# Patient Record
Sex: Male | Born: 1990 | Race: White | Hispanic: No | Marital: Single | Smoking: Never smoker
Health system: Southern US, Community
[De-identification: ages and names within clinical notes are randomized; demographics above are authoritative.]

## PROBLEM LIST (undated history)

## (undated) DIAGNOSIS — F840 Autistic disorder: Secondary | ICD-10-CM

---
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
## 2015-01-21 DIAGNOSIS — G8929 Other chronic pain: Secondary | ICD-10-CM | POA: Insufficient documentation

## 2015-01-21 DIAGNOSIS — F840 Autistic disorder: Secondary | ICD-10-CM | POA: Insufficient documentation

## 2015-01-21 DIAGNOSIS — R51 Headache: Secondary | ICD-10-CM

## 2015-01-21 DIAGNOSIS — G43909 Migraine, unspecified, not intractable, without status migrainosus: Secondary | ICD-10-CM | POA: Insufficient documentation

## 2015-05-06 ENCOUNTER — Emergency Department
Admission: EM | Admit: 2015-05-06 | Discharge: 2015-05-07 | Disposition: A | Discharge status: Home / Self Care | Payer: BLUE CROSS/BLUE SHIELD | Attending: Emergency Medicine | Admitting: Emergency Medicine

## 2015-05-06 ENCOUNTER — Encounter: Payer: Self-pay | Admitting: Urgent Care

## 2015-05-06 DIAGNOSIS — Z23 Encounter for immunization: Secondary | ICD-10-CM | POA: Insufficient documentation

## 2015-05-06 DIAGNOSIS — Y9289 Other specified places as the place of occurrence of the external cause: Secondary | ICD-10-CM | POA: Insufficient documentation

## 2015-05-06 DIAGNOSIS — S61011A Laceration without foreign body of right thumb without damage to nail, initial encounter: Secondary | ICD-10-CM

## 2015-05-06 DIAGNOSIS — W260XXA Contact with knife, initial encounter: Secondary | ICD-10-CM | POA: Insufficient documentation

## 2015-05-06 DIAGNOSIS — Y9389 Activity, other specified: Secondary | ICD-10-CM | POA: Diagnosis not present

## 2015-05-06 DIAGNOSIS — Y998 Other external cause status: Secondary | ICD-10-CM | POA: Insufficient documentation

## 2015-05-06 HISTORY — DX: Autistic disorder: F84.0

## 2015-05-06 MED ORDER — LIDOCAINE-EPINEPHRINE-TETRACAINE (LET) SOLUTION
NASAL | Status: AC
  Administered 2015-05-07: 01:00:00
  Filled 2015-05-06: qty 3

## 2015-05-06 MED ORDER — TETANUS-DIPHTH-ACELL PERTUSSIS 5-2.5-18.5 LF-MCG/0.5 IM SUSP
0.5000 mL | Freq: Once | INTRAMUSCULAR | Status: AC
  Administered 2015-05-07: 0.5 mL via INTRAMUSCULAR
  Filled 2015-05-06: qty 0.5

## 2015-05-06 NOTE — ED Provider Notes (Signed)
Medical screening examination/treatment/procedure(s) were conducted as a shared visit with non-physician practitioner(s) and myself.  I personally evaluated the patient during the encounter.  Patient has a small laceration to the right hand without neurovascular deficit. Simple repair performed by physician assistant.  Mark CreamerMark Quale, MD 05/06/15 [phone]

## 2015-05-06 NOTE — ED Provider Notes (Signed)
CSN: [id number]     Arrival date & time 05/06/15  2310 History   First MD Initiated Contact with Patient 05/06/15 2346     Chief Complaint  Patient presents with  . Laceration     (Consider location/radiation/quality/duration/timing/severity/associated sxs/prior Treatment) HPI  24 year old Webb presents to emergency department for evaluation of right thumb laceration. Patient was sharpening a knife just prior to arrival when he cut his right thumb on the volar aspect of the interphalangeal joint. He has no limited range of motion. Bleeding is controlled. No loss of sensation. Tetanus status is unknown. Pain is moderate.  Past Medical History  Diagnosis Date  . Autism    History reviewed. No pertinent past surgical history. Family History  Problem Relation Age of Onset  . Healthy Mother   . Healthy Father   . Healthy Sister   . Healthy Brother    Social History  Substance Use Topics  . Smoking status: Never Smoker   . Smokeless tobacco: Never Used  . Alcohol Use: 0.0 oz/week    0 Standard drinks or equivalent per week    Review of Systems  Constitutional: Negative.  Negative for fever, chills, activity change and appetite change.  HENT: Negative for congestion, ear pain, mouth sores, rhinorrhea, sinus pressure, sore throat and trouble swallowing.   Eyes: Negative for photophobia, pain and discharge.  Respiratory: Negative for cough, chest tightness and shortness of breath.   Cardiovascular: Negative for chest pain and leg swelling.  Gastrointestinal: Negative for nausea, vomiting, abdominal pain, diarrhea and abdominal distention.  Genitourinary: Negative for dysuria and difficulty urinating.  Musculoskeletal: Negative for back pain, arthralgias and gait problem.  Skin: Positive for wound. Negative for color change and rash.  Neurological: Negative for dizziness and headaches.  Hematological: Negative for adenopathy.  Psychiatric/Behavioral: Negative for behavioral  problems and agitation.      Allergies  Review of patient's allergies indicates no known allergies.  Home Medications   Prior to Admission medications   Not on File   BP 133/65 mmHg  Pulse 102  Temp(Src) 98.8 F (37.1 C) (Oral)  Resp 18  Ht  (1.702 m)  Wt 77.111 kg  BMI 26.62 kg/m2  SpO2 98% Physical Exam  Constitutional: He is oriented to person, place, and time. He appears well-developed and well-nourished.  HENT:  Head: Normocephalic and atraumatic.  Eyes: Conjunctivae and EOM are normal. Pupils are equal, round, and reactive to light.  Neck: Normal range of motion. Neck supple.  Cardiovascular: Normal rate, regular rhythm and intact distal pulses.   Pulmonary/Chest: Effort normal. No respiratory distress. He has no rales.  Musculoskeletal:  Examination of the right hand shows patient has full composite fist. Grip strength 5 out of 5. Right thumb shows a volar to similar laceration that is linear along the interphalangeal joint. No tendon deficit is noted. He has full active and passive extension and flexion of the interphalangeal joint. No warmth erythema or drainage.  Neurological: He is alert and oriented to person, place, and time.  Skin: Skin is warm and dry.  Psychiatric: He has a normal mood and affect. His behavior is normal. Judgment and thought content normal.    ED Course  Procedures (including critical care time) LACERATION REPAIR Performed by: Patience Musca Authorized by: Patience Musca Consent: Verbal consent obtained. Risks and benefits: risks, benefits and alternatives were discussed Consent given by: patient Patient identity confirmed: provided demographic data Prepped and Draped in normal sterile fashion Wound explored  Laceration Location: Right thumb  Laceration Length: 2 cm  No Foreign Bodies seen or palpated  Anesthesia: local infiltration  Local anesthetic: Topical LET  Anesthetic total: 1.5  ml  Irrigation method: syringe Amount of cleaning: standard  Skin closure: 5-0 nylon    Number of sutures:3  Technique: Simple interrupted   Patient tolerance: Patient tolerated the procedure well with no immediate complications. Labs Review Labs Reviewed - No data to display  Imaging Review No results found. I have personally reviewed and evaluated these images and lab results as part of my medical decision-making.   EKG Interpretation None      MDM   Final diagnoses:  Thumb laceration, right, initial encounter    24 year old Webb with right thumb laceration. No tendon deficits noted. No signs of foreign body. Tetanus was updated in the emergency department. Wound was thoroughly irrigated and 3 nylon sutures were applied. He'll follow-up in 7-8 days for suture removal. He is educated on signs and symptoms return to the ER for. He will keep wound clean and dry.  Evon Slackhomas C Gaines, PA-C 05/07/15 0005  Arnaldo NatalPaul F Malinda, MD 05/07/15 [phone]

## 2015-05-06 NOTE — ED Notes (Signed)
Patient presents with c/o laceration to 1st digit of his RIGHT hand. Patient reports that he cut it with a knife. Bleeding controlled.

## 2015-05-06 NOTE — Discharge Instructions (Signed)
Laceration Care, Adult  A laceration is a cut that goes through all layers of the skin. The cut also goes into the tissue that is right under the skin. Some cuts heal on their own. Others need to be closed with stitches (sutures), staples, skin adhesive strips, or wound glue. Taking care of your cut lowers your risk of infection and helps your cut to heal better.  HOW TO TAKE CARE OF YOUR CUT  For stitches or staples:  · Keep the wound clean and dry.  · If you were given a bandage (dressing), you should change it at least one time per day or as told by your doctor. You should also change it if it gets wet or dirty.  · Keep the wound completely dry for the first 24 hours or as told by your doctor. After that time, you may take a shower or a bath. However, make sure that the wound is not soaked in water until after the stitches or staples have been removed.  · Clean the wound one time each day or as told by your doctor:    Wash the wound with soap and water.    Rinse the wound with water until all of the soap comes off.    Pat the wound dry with a clean towel. Do not rub the wound.  · After you clean the wound, put a thin layer of antibiotic ointment on it as told by your doctor. This ointment:    Helps to prevent infection.    Keeps the bandage from sticking to the wound.  · Have your stitches or staples removed as told by your doctor.  If your doctor used skin adhesive strips:   · Keep the wound clean and dry.  · If you were given a bandage, you should change it at least one time per day or as told by your doctor. You should also change it if it gets dirty or wet.  · Do not get the skin adhesive strips wet. You can take a shower or a bath, but be careful to keep the wound dry.  · If the wound gets wet, pat it dry with a clean towel. Do not rub the wound.  · Skin adhesive strips fall off on their own. You can trim the strips as the wound heals. Do not remove any strips that are still stuck to the wound. They will  fall off after a while.  If your doctor used wound glue:  · Try to keep your wound dry, but you may briefly wet it in the shower or bath. Do not soak the wound in water, such as by swimming.  · After you take a shower or a bath, gently pat the wound dry with a clean towel. Do not rub the wound.  · Do not do any activities that will make you really sweaty until the skin glue has fallen off on its own.  · Do not apply liquid, cream, or ointment medicine to your wound while the skin glue is still on.  · If you were given a bandage, you should change it at least one time per day or as told by your doctor. You should also change it if it gets dirty or wet.  · If a bandage is placed over the wound, do not let the tape for the bandage touch the skin glue.  · Do not pick at the glue. The skin glue usually stays on for 5-10 days. Then, it   falls off of the skin.  General Instructions   · To help prevent scarring, make sure to cover your wound with sunscreen whenever you are outside after stitches are removed, after adhesive strips are removed, or when wound glue stays in place and the wound is healed. Make sure to wear a sunscreen of at least 30 SPF.  · Take over-the-counter and prescription medicines only as told by your doctor.  · If you were given antibiotic medicine or ointment, take or apply it as told by your doctor. Do not stop using the antibiotic even if your wound is getting better.  · Do not scratch or pick at the wound.  · Keep all follow-up visits as told by your doctor. This is important.  · Check your wound every day for signs of infection. Watch for:    Redness, swelling, or pain.    Fluid, blood, or pus.  · Raise (elevate) the injured area above the level of your heart while you are sitting or lying down, if possible.  GET HELP IF:  · You got a tetanus shot and you have any of these problems at the injection site:    Swelling.    Very bad pain.    Redness.    Bleeding.  · You have a fever.  · A wound that was  closed breaks open.  · You notice a bad smell coming from your wound or your bandage.  · You notice something coming out of the wound, such as wood or glass.  · Medicine does not help your pain.  · You have more redness, swelling, or pain at the site of your wound.  · You have fluid, blood, or pus coming from your wound.  · You notice a change in the color of your skin near your wound.  · You need to change the bandage often because fluid, blood, or pus is coming from the wound.  · You start to have a new rash.  · You start to have numbness around the wound.  GET HELP RIGHT AWAY IF:  · You have very bad swelling around the wound.  · Your pain suddenly gets worse and is very bad.  · You notice painful lumps near the wound or on skin that is anywhere on your body.  · You have a red streak going away from your wound.  · The wound is on your hand or foot and you cannot move a finger or toe like you usually can.  · The wound is on your hand or foot and you notice that your fingers or toes look pale or bluish.     This information is not intended to replace advice given to you by your health care provider. Make sure you discuss any questions you have with your health care provider.     Document Released: 11/17/2007 Document Revised: 10/15/2014 Document Reviewed: 05/27/2014  Elsevier Interactive Patient Education ©2016 Elsevier Inc.

## 2015-05-07 DIAGNOSIS — S61011A Laceration without foreign body of right thumb without damage to nail, initial encounter: Secondary | ICD-10-CM | POA: Diagnosis not present

## 2015-05-16 ENCOUNTER — Ambulatory Visit: Payer: Self-pay | Admitting: Family Medicine

## 2015-12-08 ENCOUNTER — Ambulatory Visit (INDEPENDENT_AMBULATORY_CARE_PROVIDER_SITE_OTHER): Payer: BLUE CROSS/BLUE SHIELD | Admitting: Family Medicine

## 2015-12-08 ENCOUNTER — Encounter: Payer: Self-pay | Admitting: Family Medicine

## 2015-12-08 VITALS — BP 122/70 | HR 88 | Temp 98.7°F | Resp 16 | Wt 174.0 lb

## 2015-12-08 DIAGNOSIS — R51 Headache: Secondary | ICD-10-CM

## 2015-12-08 DIAGNOSIS — R519 Headache, unspecified: Secondary | ICD-10-CM

## 2015-12-08 DIAGNOSIS — F840 Autistic disorder: Secondary | ICD-10-CM | POA: Diagnosis not present

## 2015-12-08 DIAGNOSIS — R7989 Other specified abnormal findings of blood chemistry: Secondary | ICD-10-CM | POA: Diagnosis not present

## 2015-12-08 DIAGNOSIS — Z0000 Encounter for general adult medical examination without abnormal findings: Secondary | ICD-10-CM | POA: Diagnosis not present

## 2015-12-08 NOTE — Progress Notes (Signed)
Patient: Mark Webb, Male    DOB: 09/15/1990, 25 y.o.   MRN: [id number] Visit Date: 12/08/2015  Today's Provider: Dortha Kernennis Chrismon, PA   No chief complaint on file.  Subjective:    Annual physical exam Mark Webb is a 25 y.o. male who presents today for health maintenance and complete physical. He feels well. He reports exercising . He reports he is sleeping fairly well.   Headache  Patient also reports that he has had a headache for several years that is becoming more frequent. Usually has an average of 2 headaches a week that last around one hour each. Patient reports that he has decreased caffeine beverages and occasionally takes Advil to help with symptoms.    Review of Systems  Constitutional: Negative.   Eyes: Negative.   Respiratory: Negative.   Cardiovascular: Negative.   Gastrointestinal: Negative.   Endocrine: Negative.   Genitourinary: Negative.   Musculoskeletal: Negative.   Skin: Negative.   Allergic/Immunologic: Negative.   Neurological: Positive for headaches.  Hematological: Negative.     Social History      He  reports that he has never smoked. He has never used smokeless tobacco. He reports that he drinks alcohol. He reports that he does not use illicit drugs.       Social History   Social History  . Marital Status: Single    Spouse Name: N/A  . Number of Children: N/A  . Years of Education: N/A   Social History Main Topics  . Smoking status: Never Smoker   . Smokeless tobacco: Never Used  . Alcohol Use: 0.0 oz/week    0 Standard drinks or equivalent per week  . Drug Use: No  . Sexual Activity: Not on file   Other Topics Concern  . Not on file   Social History Narrative   Past Medical History  Diagnosis Date  . Autism    Patient Active Problem List   Diagnosis Date Noted  . Active autistic disorder 01/21/2015  . Generalized headache 01/21/2015  . Migraine 01/21/2015    No past surgical history on file.  Family  History        Family Status  Relation Status Death Age  . Mother Alive   . Father Alive   . Sister Alive   . Brother Alive         His family history includes Healthy in his brother, father, mother, and sister.    No Known Allergies  No outpatient prescriptions have been marked as taking for the 12/08/15 encounter (Appointment) with Tamsen Roersennis E Chrismon, PA.    Patient Care Team: Tamsen Roersennis E Chrismon, PA as PCP - General (Physician Assistant)     Objective:   Vitals: BP 122/70 mmHg  Pulse 88  Temp(Src) 98.7 F (37.1 C)  Resp 16  Wt 174 lb (78.926 kg) Body mass index is 27.25 kg/(m^2). Wt Readings from Last 3 Encounters:  12/08/15 174 lb (78.926 kg)  05/06/15 170 lb (77.111 kg)  07/12/14 141 lb (63.957 kg)   Physical Exam  Constitutional: He appears well-developed and well-nourished.  HENT:  Head: Normocephalic.  Right Ear: External ear normal.  Left Ear: External ear normal.  Nose: Nose normal.  Mouth/Throat: Oropharynx is clear and moist.  Eyes: Conjunctivae and EOM are normal.  Neck: Neck supple. No thyromegaly present.  Cardiovascular: Normal rate and regular rhythm.   Pulmonary/Chest: Effort normal and breath sounds normal.  Abdominal: Bowel sounds are normal.  Genitourinary:  Anxious and uncooperative for exam.  Musculoskeletal: Normal range of motion.  Lymphadenopathy:    He has no cervical adenopathy.  Neurological: Coordination normal.  Uncooperative for assessment of cranial nerves. No muscle weakness.  Skin:  Mild itchy rash on lower legs/ankles.  Psychiatric:  Very quiet and difficult to assess due to autism.   Assessment & Plan:     Routine Health Maintenance and Physical Exam  Exercise Activities and Dietary recommendations Goals    Very active outdoors and helping grandparents. No formal exercise program.      Immunization History  Administered Date(s) Administered  . Tdap 05/07/2015    There are no preventive care reminders to display  for this patient.    Discussed health benefits of physical activity, and encouraged him to engage in regular exercise appropriate for his age and condition.    -------------------------------------------------------------------- 1. Annual physical exam General health stable. Eats 2 meals a day. Has not lost weight. Helps grandparents with their garden daily and enjoys outdoor activities. Last Tdap was 05-07-15 because he cut his left thumb and needed stitches. Given anticipatory guidance with his history of autism. Recheck routine labs. - CBC with Differential/Platelet - Comprehensive metabolic panel - TSH - Lipid panel  2. Generalized headache Has 1-2 headaches a week and takes Advil or Tylenol prn with good relief in an hour. No nausea, vision disturbance, photosensitivity or URI symptoms. Headache is usually in the back of his neck or occiput. Not unilateral. May use OTC meds prn and recheck labs. If no imbalances, may need referral to neurologist. - CBC with Differential/Platelet - Comprehensive metabolic panel - TSH  3. Active autistic disorder Anxious with introversion mentally. Difficult to cooperate with exams. Stable and not on any medications. Accompanied by mother.  4. Low serum vitamin D History of Vitamin D 18.7 one year ago. Has not continued supplementation. May be part of headaches. Will recheck blood levels. - VITAMIN D 25 Hydroxy (Vit-D Deficiency, Fractures)    Dortha Kernennis Chrismon, PA  [facility]

## 2015-12-09 ENCOUNTER — Other Ambulatory Visit: Payer: Self-pay | Admitting: Family Medicine

## 2015-12-10 LAB — COMPREHENSIVE METABOLIC PANEL
ALBUMIN: 4.5 g/dL (ref 3.5–5.5)
ALK PHOS: 71 IU/L (ref 39–117)
ALT: 16 IU/L (ref 0–44)
AST: 19 IU/L (ref 0–40)
Albumin/Globulin Ratio: 1.4 (ref 1.2–2.2)
BUN/Creatinine Ratio: 10 (ref 9–20)
BUN: 10 mg/dL (ref 6–20)
Bilirubin Total: <0.2 mg/dL (ref 0.0–1.2)
CO2: 24 mmol/L (ref 18–29)
CREATININE: 0.97 mg/dL (ref 0.76–1.27)
Calcium: 9.4 mg/dL (ref 8.7–10.2)
Chloride: 101 mmol/L (ref 96–106)
GFR calc Af Amer: 126 mL/min/{1.73_m2} (ref >59)
GFR, EST NON AFRICAN AMERICAN: 109 mL/min/{1.73_m2} (ref >59)
GLOBULIN, TOTAL: 3.2 g/dL (ref 1.5–4.5)
Glucose: 91 mg/dL (ref 65–99)
Potassium: 3.9 mmol/L (ref 3.5–5.2)
SODIUM: 139 mmol/L (ref 134–144)
Total Protein: 7.7 g/dL (ref 6.0–8.5)

## 2015-12-10 LAB — LIPID PANEL
CHOLESTEROL TOTAL: 272 mg/dL — AB (ref 100–199)
Chol/HDL Ratio: 7.6 ratio units — ABNORMAL HIGH (ref 0.0–5.0)
HDL: 36 mg/dL — AB (ref >39)
LDL CALC: 178 mg/dL — AB (ref 0–99)
Triglycerides: 288 mg/dL — ABNORMAL HIGH (ref 0–149)
VLDL CHOLESTEROL CAL: 58 mg/dL — AB (ref 5–40)

## 2015-12-10 LAB — CBC WITH DIFFERENTIAL/PLATELET
BASOS ABS: 0 10*3/uL (ref 0.0–0.2)
Basos: 1 %
EOS (ABSOLUTE): 0.1 10*3/uL (ref 0.0–0.4)
EOS: 1 %
HEMATOCRIT: 42.1 % (ref 37.5–51.0)
HEMOGLOBIN: 14.5 g/dL (ref 12.6–17.7)
IMMATURE GRANS (ABS): 0 10*3/uL (ref 0.0–0.1)
Immature Granulocytes: 0 %
LYMPHS ABS: 2.1 10*3/uL (ref 0.7–3.1)
LYMPHS: 36 %
MCH: 29.6 pg (ref 26.6–33.0)
MCHC: 34.4 g/dL (ref 31.5–35.7)
MCV: 86 fL (ref 79–97)
MONOCYTES: 11 %
Monocytes Absolute: 0.6 10*3/uL (ref 0.1–0.9)
NEUTROS ABS: 2.9 10*3/uL (ref 1.4–7.0)
Neutrophils: 51 %
Platelets: 313 10*3/uL (ref 150–379)
RBC: 4.9 x10E6/uL (ref 4.14–5.80)
RDW: 13.4 % (ref 12.3–15.4)
WBC: 5.8 10*3/uL (ref 3.4–10.8)

## 2015-12-10 LAB — TSH: TSH: 1.94 u[IU]/mL (ref 0.450–4.500)

## 2015-12-10 LAB — VITAMIN D 25 HYDROXY (VIT D DEFICIENCY, FRACTURES): VIT D 25 HYDROXY: 32.5 ng/mL (ref 30.0–100.0)

## 2016-06-23 ENCOUNTER — Ambulatory Visit (INDEPENDENT_AMBULATORY_CARE_PROVIDER_SITE_OTHER): Payer: BLUE CROSS/BLUE SHIELD | Admitting: Physician Assistant

## 2016-06-23 ENCOUNTER — Ambulatory Visit
Admission: RE | Admit: 2016-06-23 | Discharge: 2016-06-23 | Disposition: A | Discharge status: Home / Self Care | Payer: BLUE CROSS/BLUE SHIELD | Source: Ambulatory Visit | Attending: Physician Assistant | Admitting: Physician Assistant

## 2016-06-23 ENCOUNTER — Encounter: Payer: Self-pay | Admitting: Physician Assistant

## 2016-06-23 VITALS — BP 120/60 | HR 70 | Temp 98.2°F | Resp 16 | Wt 175.2 lb

## 2016-06-23 DIAGNOSIS — R195 Other fecal abnormalities: Secondary | ICD-10-CM | POA: Diagnosis not present

## 2016-06-23 DIAGNOSIS — R1032 Left lower quadrant pain: Secondary | ICD-10-CM | POA: Insufficient documentation

## 2016-06-23 NOTE — Progress Notes (Signed)
Patient's mother Doran Heaterina Nuss (on consent) advised.

## 2016-06-23 NOTE — Progress Notes (Signed)
Name: Mark Webb   MRN: [id number]    DOB: 05/25/1991   Date:06/23/2016       Progress Note  Subjective  Chief Complaint  Chief Complaint  Patient presents with  . Abdominal Pain    HPI Patient is a 26 y/o male with autism who comes in office today accompanied by his mother with concerns of LLQ pain for the past three days. It is a bit difficult to obtain history, but the patient and mother both answer questions. Patient describes pain as stabbing in his LLQ upon standing. Patient did report G.I upset with constipation. Patient has had no changes in his daily activities or eating habits. Patient reports he had a bowel movement last night that was small and that he had to strain for. No blood in stool. No fever, chills, nausea, vomiting. He reports he has had to pee more but there has been no burning. He doesn't have any back or flank pain. No history of diverticulitis or kidney stone. The pain does not seem to radiate. Patient describes his pain as "not very painful." Tried azo tabs yesterday without much relief. Mother is worried about appendicitis.   Past Medical History:  Diagnosis Date  . Autism     Social History  Substance Use Topics  . Smoking status: Never Smoker  . Smokeless tobacco: Never Used  . Alcohol use 0.0 oz/week    No current outpatient prescriptions on file.  No Known Allergies  Review of Systems  Constitutional: Negative for chills, diaphoresis, fever, malaise/fatigue and weight loss.  HENT: Negative.   Eyes: Negative.   Respiratory: Negative.   Cardiovascular: Negative.   Gastrointestinal: Positive for abdominal pain and constipation. Negative for blood in stool, diarrhea, heartburn, melena, nausea and vomiting.  Genitourinary: Positive for frequency. Negative for dysuria, flank pain, hematuria and urgency.  Musculoskeletal: Positive for back pain. Negative for falls, joint pain, myalgias and neck pain.  Skin: Negative.   Neurological: Negative.   Negative for weakness.  Endo/Heme/Allergies: Negative.   Psychiatric/Behavioral: Negative.       Objective  Vitals:   06/23/16 1339  BP: 120/60  Pulse: 70  Resp: 16  Temp: 98.2 F (36.8 C)  TempSrc: Oral  Weight: 175 lb 3.2 oz (79.5 kg)     Physical Exam  Constitutional: He is well-developed, well-nourished, and in no distress. No distress.  Cardiovascular: Normal rate and regular rhythm.   Pulmonary/Chest: Effort normal and breath sounds normal.  Abdominal: Soft. Bowel sounds are normal. He exhibits no distension and no mass. There is tenderness in the left lower quadrant. There is no rigidity, no rebound, no guarding, no CVA tenderness, no tenderness at McBurney's point and negative Murphy's sign. No hernia.  Some mild tenderness in LLQ on deep palpation.  Neurological: He is alert.  Skin: He is not diaphoretic.    No results found for this or any previous visit (from the past 2160 hour(s)).   Assessment & Plan  1. LLQ pain  Patient afebrile, nontoxic and is presenting with LLQ pain concerning for constipation, possible kidney stone, diverticulitis, UTI. Low suspicion for appendicitis. No peritoneal signs. Patient did give urine sample today in office but had azo tabs yesterday so will send off for UA and culture. Patient is bit of an unclear historian and so will evaluate with labs and imaging as below to assess for white count, kidney/liver function, and abdominal pathology.  - Comprehensive metabolic panel - CBC with Differential/Platelet - DG Abd 1 View; Future -  Urinalysis - CULTURE, URINE COMPREHENSIVE  The entirety of the information documented in the History of Present Illness, Review of Systems and Physical Exam were personally obtained by me. Portions of this information were initially documented by Natalia LeatherwoodKatherine and reviewed by me for thoroughness and accuracy.   Return if symptoms worsen or fail to improve.      Patient Instructions  Abdominal Pain,  Adult Many things can cause belly (abdominal) pain. Most times, belly pain is not dangerous. Many cases of belly pain can be watched and treated at home. Sometimes belly pain is serious, though. Your doctor will try to find the cause of your belly pain. Follow these instructions at home:  Take over-the-counter and prescription medicines only as told by your doctor. Do not take medicines that help you poop (laxatives) unless told to by your doctor.  Drink enough fluid to keep your pee (urine) clear or pale yellow.  Watch your belly pain for any changes.  Keep all follow-up visits as told by your doctor. This is important. Contact a doctor if:  Your belly pain changes or gets worse.  You are not hungry, or you lose weight without trying.  You are having trouble pooping (constipated) or have watery poop (diarrhea) for more than 2-3 days.  You have pain when you pee or poop.  Your belly pain wakes you up at night.  Your pain gets worse with meals, after eating, or with certain foods.  You are throwing up and cannot keep anything down.  You have a fever. Get help right away if:  Your pain does not go away as soon as your doctor says it should.  You cannot stop throwing up.  Your pain is only in areas of your belly, such as the right side or the left lower part of the belly.  You have bloody or black poop, or poop that looks like tar.  You have very bad pain, cramping, or bloating in your belly.  You have signs of not having enough fluid or water in your body (dehydration), such as:  Dark pee, very little pee, or no pee.  Cracked lips.  Dry mouth.  Sunken eyes.  Sleepiness.  Weakness. This information is not intended to replace advice given to you by your health care provider. Make sure you discuss any questions you have with your health care provider. Document Released: 11/17/2007 Document Revised: 12/19/2015 Document Reviewed: 11/12/2015 Elsevier Interactive Patient  Education  2017 [employer]

## 2016-06-23 NOTE — Patient Instructions (Signed)

## 2016-06-24 ENCOUNTER — Other Ambulatory Visit: Payer: Self-pay | Admitting: Physician Assistant

## 2016-06-24 DIAGNOSIS — N390 Urinary tract infection, site not specified: Secondary | ICD-10-CM

## 2016-06-24 LAB — COMPREHENSIVE METABOLIC PANEL
ALT: 17 IU/L (ref 0–44)
AST: 18 IU/L (ref 0–40)
Albumin/Globulin Ratio: 1.7 (ref 1.2–2.2)
Albumin: 4.3 g/dL (ref 3.5–5.5)
Alkaline Phosphatase: 67 IU/L (ref 39–117)
BUN/Creatinine Ratio: 12 (ref 9–20)
BUN: 10 mg/dL (ref 6–20)
Bilirubin Total: 0.3 mg/dL (ref 0.0–1.2)
CO2: 25 mmol/L (ref 18–29)
Calcium: 9.6 mg/dL (ref 8.7–10.2)
Chloride: 100 mmol/L (ref 96–106)
Creatinine, Ser: 0.85 mg/dL (ref 0.76–1.27)
GFR calc Af Amer: 140 mL/min/{1.73_m2} (ref >59)
GFR calc non Af Amer: 121 mL/min/{1.73_m2} (ref >59)
Globulin, Total: 2.6 g/dL (ref 1.5–4.5)
Glucose: 87 mg/dL (ref 65–99)
Potassium: 3.8 mmol/L (ref 3.5–5.2)
Sodium: 140 mmol/L (ref 134–144)
Total Protein: 6.9 g/dL (ref 6.0–8.5)

## 2016-06-24 LAB — CBC WITH DIFFERENTIAL/PLATELET
Basophils Absolute: 0 10*3/uL (ref 0.0–0.2)
Basos: 1 %
EOS (ABSOLUTE): 0.1 10*3/uL (ref 0.0–0.4)
Eos: 1 %
Hematocrit: 40.5 % (ref 37.5–51.0)
Hemoglobin: 13.6 g/dL (ref 13.0–17.7)
Immature Grans (Abs): 0 10*3/uL (ref 0.0–0.1)
Immature Granulocytes: 0 %
Lymphocytes Absolute: 2.3 10*3/uL (ref 0.7–3.1)
Lymphs: 33 %
MCH: 28.9 pg (ref 26.6–33.0)
MCHC: 33.6 g/dL (ref 31.5–35.7)
MCV: 86 fL (ref 79–97)
Monocytes Absolute: 0.7 10*3/uL (ref 0.1–0.9)
Monocytes: 10 %
Neutrophils Absolute: 3.9 10*3/uL (ref 1.4–7.0)
Neutrophils: 55 %
Platelets: 277 10*3/uL (ref 150–379)
RBC: 4.7 x10E6/uL (ref 4.14–5.80)
RDW: 13.6 % (ref 12.3–15.4)
WBC: 6.9 10*3/uL (ref 3.4–10.8)

## 2016-06-24 LAB — URINALYSIS
Bilirubin, UA: NEGATIVE
Glucose, UA: NEGATIVE
Ketones, UA: NEGATIVE
Nitrite, UA: POSITIVE — AB
Protein, UA: NEGATIVE
RBC, UA: NEGATIVE
Specific Gravity, UA: 1.01 (ref 1.005–1.030)
Urobilinogen, Ur: 1 mg/dL (ref 0.2–1.0)
pH, UA: 5.5 (ref 5.0–7.5)

## 2016-06-24 MED ORDER — CIPROFLOXACIN HCL 500 MG PO TABS: 500.0000 mg | ORAL_TABLET | Freq: Two times a day (BID) | ORAL | 0 refills | Status: AC

## 2016-06-24 NOTE — Progress Notes (Signed)
Mrs. Freida Busmanllen advised as directed below.  She will get Luisa Hartatrick started on Cipro.  She states that he is not sexually active.        Abdominal Xray showed constipation. CBC and CMET were normal. UA showed signs of infection. Will send in ciprofloxacin 500 mg BID x 14 days. One question I didn't address in office was is there any sexual exposure or risk of STDs? We ask this question because urinary tract infections in men are generally rare. If not, please continue with ciprofloxacin for the prescribed course and we will await culture.

## 2016-06-26 LAB — CULTURE, URINE COMPREHENSIVE

## 2016-06-28 ENCOUNTER — Telehealth: Payer: Self-pay

## 2016-06-28 ENCOUNTER — Other Ambulatory Visit: Payer: Self-pay | Admitting: Physician Assistant

## 2016-06-28 DIAGNOSIS — K5900 Constipation, unspecified: Secondary | ICD-10-CM

## 2016-06-28 MED ORDER — DOCUSATE SODIUM 100 MG PO CAPS: 100.0000 mg | ORAL_CAPSULE | Freq: Two times a day (BID) | ORAL | 0 refills | Status: AC

## 2016-06-28 NOTE — Telephone Encounter (Signed)
Patient's mother called and states she has not heard results of patient's lab results. She is aware of urine and xray results. Please review-aa

## 2016-06-28 NOTE — Progress Notes (Signed)
Spoke with patient's mother in clinic today and patient is having a lot of constipation which is aggravating hemorrhoids. XRAY showed constipation. Will prescribe docusate sodium for one week as stool softener. May use OTC meds for presumed hemorrhoids and hopefully this will help with flare.

## 2016-06-28 NOTE — Telephone Encounter (Signed)
Mother advised-aa 

## 2016-06-28 NOTE — Telephone Encounter (Signed)
CBC and CMET were normal. Thank you.

## 2016-07-02 ENCOUNTER — Ambulatory Visit (INDEPENDENT_AMBULATORY_CARE_PROVIDER_SITE_OTHER): Payer: BLUE CROSS/BLUE SHIELD | Admitting: Physician Assistant

## 2016-07-02 ENCOUNTER — Encounter: Payer: Self-pay | Admitting: Physician Assistant

## 2016-07-02 VITALS — BP 124/82 | HR 80 | Temp 98.6°F | Resp 16 | Wt 167.0 lb

## 2016-07-02 DIAGNOSIS — K5900 Constipation, unspecified: Secondary | ICD-10-CM

## 2016-07-02 DIAGNOSIS — K6289 Other specified diseases of anus and rectum: Secondary | ICD-10-CM

## 2016-07-02 MED ORDER — FLEET ENEMA 7-19 GM/118ML RE ENEM: 1.0000 | ENEMA | RECTAL | 0 refills | Status: DC

## 2016-07-02 MED ORDER — HYDROCORTISONE 2.5 % RE CREA: 1.0000 "application " | TOPICAL_CREAM | Freq: Two times a day (BID) | RECTAL | 0 refills | Status: DC

## 2016-07-02 NOTE — Patient Instructions (Signed)
Constipation, Adult °Constipation is when a person: °· Poops (has a bowel movement) fewer times in a week than normal. °· Has a hard time pooping. °· Has poop that is dry, hard, or bigger than normal. ° °Follow these instructions at home: °Eating and drinking ° °· Eat foods that have a lot of fiber, such as: °? Fresh fruits and vegetables. °? Whole grains. °? Beans. °· Eat less of foods that are high in fat, low in fiber, or overly processed, such as: °? French fries. °? Hamburgers. °? Cookies. °? Candy. °? Soda. °· Drink enough fluid to keep your pee (urine) clear or pale yellow. °General instructions °· Exercise regularly or as told by your doctor. °· Go to the restroom when you feel like you need to poop. Do not hold it in. °· Take over-the-counter and prescription medicines only as told by your doctor. These include any fiber supplements. °· Do pelvic floor retraining exercises, such as: °? Doing deep breathing while relaxing your lower belly (abdomen). °? Relaxing your pelvic floor while pooping. °· Watch your condition for any changes. °· Keep all follow-up visits as told by your doctor. This is important. °Contact a doctor if: °· You have pain that gets worse. °· You have a fever. °· You have not pooped for 4 days. °· You throw up (vomit). °· You are not hungry. °· You lose weight. °· You are bleeding from the anus. °· You have thin, pencil-like poop (stool). °Get help right away if: °· You have a fever, and your symptoms suddenly get worse. °· You leak poop or have blood in your poop. °· Your belly feels hard or bigger than normal (is bloated). °· You have very bad belly pain. °· You feel dizzy or you faint. °This information is not intended to replace advice given to you by your health care provider. Make sure you discuss any questions you have with your health care provider. °Document Released: 11/17/2007 Document Revised: 12/19/2015 Document Reviewed: 11/19/2015 °Elsevier Interactive Patient Education ©  2017 Elsevier Inc. ° °

## 2016-07-02 NOTE — Progress Notes (Signed)
Patient: Mark Webb Male    DOB: December 21, 1990   25 y.o.   MRN: [id number] Visit Date: 07/02/2016  Today's Provider: Trey SailorsAdriana M Pollak, PA-C   Chief Complaint  Patient presents with  . Constipation   Subjective:    Constipation  The current episode started 1 to 4 weeks ago (Has been going on for three weeks. ). The problem is unchanged. His stool frequency is 2 to 3 times per week. The patient is on a high fiber diet. There has been adequate water intake. Associated symptoms include abdominal pain, bloating, difficulty urinating, hemorrhoids and vomiting (Pt reports vomiting two days ago. ). Pertinent negatives include no diarrhea, fever, nausea or rectal pain. He has tried laxatives, diet changes and fiber for the symptoms. The treatment provided no relief.   Patient is presenting with his mother today for continuation of the same, constipation. Was constipated when I saw him in clinic 07/05/2016. Has tried drinking a bottle of magnesium citrate, Metamucil, docusate sodium with no relief. Has been having minimal bowel movements despite adequate fluid intake. Mother thinks patient might have hemorrhoids because he complains of rectal pain and some bright red blood with bowel movements.      No Known Allergies   Current Outpatient Prescriptions:  .  docusate sodium (COLACE) 100 MG capsule, Take 1 capsule (100 mg total) by mouth 2 (two) times daily., Disp: 14 capsule, Rfl: 0 .  ciprofloxacin (CIPRO) 500 MG tablet, Take 1 tablet (500 mg total) by mouth 2 (two) times daily. (Patient not taking: Reported on 07/02/2016), Disp: 28 tablet, Rfl: 0  Review of Systems  Constitutional: Positive for chills and fatigue. Negative for activity change, appetite change, diaphoresis, fever and unexpected weight change.  Gastrointestinal: Positive for abdominal distention, abdominal pain, bloating, constipation, hemorrhoids and vomiting (Pt reports vomiting two days ago. ). Negative for anal  bleeding, blood in stool, diarrhea, nausea and rectal pain.  Genitourinary: Positive for difficulty urinating, dysuria and urgency. Negative for decreased urine volume, frequency and hematuria.  Neurological: Positive for weakness (Leg feel week. ).    Social History  Substance Use Topics  . Smoking status: Never Smoker  . Smokeless tobacco: Never Used  . Alcohol use 0.0 oz/week   Objective:   BP 124/82 (BP Location: Left Arm, Patient Position: Sitting, Cuff Size: Normal)   Pulse 80   Temp 98.6 F (37 C) (Oral)   Resp 16   Wt 167 lb (75.8 kg)   BMI 26.16 kg/m   Physical Exam  Constitutional: He is oriented to person, place, and time. He appears well-developed and well-nourished. No distress.  Cardiovascular: Normal rate and regular rhythm.   Abdominal: Soft. Bowel sounds are normal. He exhibits no distension and no mass. There is no tenderness. There is no rebound and no guarding.  Neurological: He is alert and oriented to person, place, and time.  Skin: He is not diaphoretic.        Assessment & Plan:     1. Constipation, unspecified constipation type  Treats as below with enema. If first one does not have results, patient may wait a few days and try the second. Stop all other bowel treatments.  - sodium phosphate (FLEET) 7-19 GM/118ML ENEM; Place 133 mLs (1 enema total) rectally 2 (two) times a week.  Dispense: 2 enema; Refill: 0  2. Rectal pain  Patient has autism and is anxious to have GU exam. Will prescribe Rx cream for presumed hemorrhoids to  see if this will help relieve pain of bowel movements.   - hydrocortisone (ANUSOL-HC) 2.5 % rectal cream; Place 1 application rectally 2 (two) times daily.  Dispense: 30 g; Refill: 0  Return if symptoms worsen or fail to improve.   Patient Instructions  Constipation, Adult Constipation is when a person:  Poops (has a bowel movement) fewer times in a week than normal.  Has a hard time pooping.  Has poop that is dry,  hard, or bigger than normal. Follow these instructions at home: Eating and drinking  Eat foods that have a lot of fiber, such as:  Fresh fruits and vegetables.  Whole grains.  Beans.  Eat less of foods that are high in fat, low in fiber, or overly processed, such as:  [place] fries.  Hamburgers.  Cookies.  Candy.  Soda.  Drink enough fluid to keep your pee (urine) clear or pale yellow. General instructions  Exercise regularly or as told by your doctor.  Go to the restroom when you feel like you need to poop. Do not hold it in.  Take over-the-counter and prescription medicines only as told by your doctor. These include any fiber supplements.  Do pelvic floor retraining exercises, such as:  Doing deep breathing while relaxing your lower belly (abdomen).  Relaxing your pelvic floor while pooping.  Watch your condition for any changes.  Keep all follow-up visits as told by your doctor. This is important. Contact a doctor if:  You have pain that gets worse.  You have a fever.  You have not pooped for 4 days.  You throw up (vomit).  You are not hungry.  You lose weight.  You are bleeding from the anus.  You have thin, pencil-like poop (stool). Get help right away if:  You have a fever, and your symptoms suddenly get worse.  You leak poop or have blood in your poop.  Your belly feels hard or bigger than normal (is bloated).  You have very bad belly pain.  You feel dizzy or you faint. This information is not intended to replace advice given to you by your health care provider. Make sure you discuss any questions you have with your health care provider. Document Released: 11/17/2007 Document Revised: 12/19/2015 Document Reviewed: 11/19/2015 Elsevier Interactive Patient Education  2017 [employer].   The entirety of the information documented in the History of Present Illness, Review of Systems and Physical Exam were personally obtained by me.  Portions of this information were initially documented by Kavin Leech, CMA and reviewed by me for thoroughness and accuracy.          Trey Sailors, PA-C  [facility]

## 2016-07-06 ENCOUNTER — Telehealth: Payer: Self-pay

## 2016-07-06 ENCOUNTER — Other Ambulatory Visit: Payer: Self-pay | Admitting: Physician Assistant

## 2016-07-06 DIAGNOSIS — K649 Unspecified hemorrhoids: Secondary | ICD-10-CM

## 2016-07-06 DIAGNOSIS — K5900 Constipation, unspecified: Secondary | ICD-10-CM

## 2016-07-06 NOTE — Telephone Encounter (Signed)
Mother called requesting referral to Dr. Earnest ConroyElliott's office for his hemorrhoids. Was seen on 07/02/2016 for this. Allene DillonEmily Drozdowski, CMA

## 2016-07-06 NOTE — Progress Notes (Signed)
Have put in GI referral to Dr. Mechele CollinElliott. Maralyn SagoSarah should be calling family with appointment time.

## 2016-08-05 DIAGNOSIS — K5900 Constipation, unspecified: Secondary | ICD-10-CM | POA: Insufficient documentation

## 2017-10-06 ENCOUNTER — Ambulatory Visit (INDEPENDENT_AMBULATORY_CARE_PROVIDER_SITE_OTHER): Payer: Medicaid Other | Admitting: Family Medicine

## 2017-10-06 ENCOUNTER — Encounter: Payer: Self-pay | Admitting: Family Medicine

## 2017-10-06 VITALS — BP 132/88 | HR 93 | Temp 98.2°F | Ht 68.0 in | Wt 189.0 lb

## 2017-10-06 DIAGNOSIS — Z114 Encounter for screening for human immunodeficiency virus [HIV]: Secondary | ICD-10-CM

## 2017-10-06 DIAGNOSIS — Z0000 Encounter for general adult medical examination without abnormal findings: Secondary | ICD-10-CM

## 2017-10-06 DIAGNOSIS — F840 Autistic disorder: Secondary | ICD-10-CM | POA: Diagnosis not present

## 2017-10-06 NOTE — Progress Notes (Signed)
Patient: Mark Webb, Male    DOB: 08-25-1990, 27 y.o.   MRN: [id number] Visit Date: 10/06/2017  Today's Provider: Dortha Kernennis Slayter Moorhouse, PA   Chief Complaint  Patient presents with  . Annual Exam   Subjective:    Annual physical exam Mark Webb is a 27 y.o. male who presents today for health maintenance and complete physical. He feels fairly well. He reports exercising none. He reports he is sleeping fairly well.  -----------------------------------------------------------------   Review of Systems  Constitutional: Positive for fatigue and unexpected weight change.  HENT: Positive for congestion, postnasal drip and rhinorrhea.   Eyes: Negative.   Respiratory: Positive for cough.   Cardiovascular: Negative.   Gastrointestinal: Negative.   Endocrine: Positive for polydipsia and polyphagia.  Genitourinary: Negative.   Musculoskeletal: Negative.   Skin: Negative.   Allergic/Immunologic: Negative.   Neurological: Negative.   Hematological: Negative.   Psychiatric/Behavioral: Negative.     Social History      He  reports that he has never smoked. He has never used smokeless tobacco. He reports that he drinks alcohol. He reports that he does not use drugs.       Social History   Socioeconomic History  . Marital status: Single    Spouse name: Not on file  . Number of children: Not on file  . Years of education: Not on file  . Highest education level: Not on file  Occupational History  . Not on file  Social Needs  . Financial resource strain: Not on file  . Food insecurity:    Worry: Not on file    Inability: Not on file  . Transportation needs:    Medical: Not on file    Non-medical: Not on file  Tobacco Use  . Smoking status: Never Smoker  . Smokeless tobacco: Never Used  Substance and Sexual Activity  . Alcohol use: Yes    Alcohol/week: 0.0 oz  . Drug use: No  . Sexual activity: Not on file  Lifestyle  . Physical activity:    Days per week: Not on  file    Minutes per session: Not on file  . Stress: Not on file  Relationships  . Social connections:    Talks on phone: Not on file    Gets together: Not on file    Attends religious service: Not on file    Active member of club or organization: Not on file    Attends meetings of clubs or organizations: Not on file    Relationship status: Not on file  Other Topics Concern  . Not on file  Social History Narrative  . Not on file    Past Medical History:  Diagnosis Date  . Autism    Patient Active Problem List   Diagnosis Date Noted  . Constipation 08/05/2016  . Active autistic disorder 01/21/2015  . Generalized headache 01/21/2015  . Migraine 01/21/2015   History reviewed. No pertinent surgical history.  Family History        Family Status  Relation Name Status  . Mother  Alive  . Father  Alive  . Sister  Alive  . Brother  Alive  . MGM  Alive  . MGF  Deceased  . PGM  Alive  . PGF  Deceased        His family history includes COPD in his maternal grandfather and paternal grandfather; Diabetes in his paternal grandfather; Healthy in his brother, mother, and sister; Hypertension in his father,  maternal grandmother, and paternal grandmother.     No Known Allergies  No current outpatient medications on file.   Patient Care Team: Cordaryl Decelles, Jodell Cipro, PA as PCP - General (Physician Assistant)      Objective:   Vitals: BP 132/88 (BP Location: Right Arm, Patient Position: Sitting, Cuff Size: Normal)   Pulse 93   Temp 98.2 F (36.8 C) (Oral)   Ht 5\' 8"  (1.727 m)   Wt 189 lb (85.7 kg)   SpO2 98%   BMI 28.74 kg/m  Wt Readings from Last 3 Encounters:  10/06/17 189 lb (85.7 kg)  07/02/16 167 lb (75.8 kg)  06/23/16 175 lb 3.2 oz (79.5 kg)     Physical Exam  Constitutional: He is oriented to person, place, and time. He appears well-developed and well-nourished.  HENT:  Head: Normocephalic and atraumatic.  Right Ear: External ear normal.  Left Ear: External ear  normal.  Nose: Nose normal.  Mouth/Throat: Oropharynx is clear and moist.  Eyes: Pupils are equal, round, and reactive to light. Conjunctivae and EOM are normal. Right eye exhibits no discharge.  Neck: Normal range of motion. Neck supple. No tracheal deviation present. No thyromegaly present.  Cardiovascular: Normal rate, regular rhythm, normal heart sounds and intact distal pulses.  No murmur heard. Pulmonary/Chest: Effort normal and breath sounds normal. No respiratory distress. He has no wheezes. He has no rales. He exhibits no tenderness.  Abdominal: Soft. He exhibits no distension and no mass. There is no tenderness. There is no rebound and no guarding.  Musculoskeletal: Normal range of motion. He exhibits no edema or tenderness.  Lymphadenopathy:    He has no cervical adenopathy.  Neurological: He is alert and oriented to person, place, and time. He has normal reflexes. He displays normal reflexes. No cranial nerve deficit. He exhibits normal muscle tone. Coordination normal.  Skin: Skin is warm and dry. No rash noted. No erythema.  Psychiatric: His affect is blunt. His speech is delayed. He is slowed.    Depression Screen PHQ 2/9 Scores 10/06/2017  PHQ - 2 Score 0  PHQ- 9 Score 0   Assessment & Plan:     Routine Health Maintenance and Physical Exam  Exercise Activities and Dietary recommendations Goals    Walking 1.5 miles 2-3 days a week and work in the yard with his grandmother frequently.      Immunization History  Administered Date(s) Administered  . Influenza,inj,Quad PF,6+ Mos 03/28/2013  . Tdap 05/07/2015    Health Maintenance  Topic Date Due  . HIV Screening  04/25/2006  . INFLUENZA VACCINE  01/12/2018  . TETANUS/TDAP  05/06/2025    Discussed health benefits of physical activity, and encouraged him to engage in regular exercise appropriate for his age and condition.    -------------------------------------------------------------------- 1. Annual  physical exam General health stable. Paternal aunts and uncles with diabetes. Immunizations up to date. Has started a walking exercise program 2-3 days a week. Eating well. Occasionally uses Miralax for constipation and Claritin for allergic rhinitis. Check routine labs and follow up pending reports. - Lipid Profile - TSH - Comprehensive Metabolic Panel (CMET) - CBC with Differential  2. Screening for HIV (human immunodeficiency virus) - HIV antibody  3. Active autistic disorder Stable and stays active helping grandmother with her yard work. Check routine labs. - Comprehensive Metabolic Panel (CMET) - CBC with Differential    Dortha Kern, PA  [facility]

## 2017-10-07 LAB — CBC WITH DIFFERENTIAL/PLATELET
BASOS: 1 %
Basophils Absolute: 0.1 10*3/uL (ref 0.0–0.2)
EOS (ABSOLUTE): 0.2 10*3/uL (ref 0.0–0.4)
Eos: 2 %
HEMOGLOBIN: 14.3 g/dL (ref 13.0–17.7)
Hematocrit: 42.6 % (ref 37.5–51.0)
IMMATURE GRANS (ABS): 0 10*3/uL (ref 0.0–0.1)
Immature Granulocytes: 0 %
LYMPHS: 23 %
Lymphocytes Absolute: 2 10*3/uL (ref 0.7–3.1)
MCH: 28.7 pg (ref 26.6–33.0)
MCHC: 33.6 g/dL (ref 31.5–35.7)
MCV: 86 fL (ref 79–97)
MONOCYTES: 12 %
Monocytes Absolute: 1 10*3/uL — ABNORMAL HIGH (ref 0.1–0.9)
NEUTROS PCT: 62 %
Neutrophils Absolute: 5.4 10*3/uL (ref 1.4–7.0)
PLATELETS: 341 10*3/uL (ref 150–379)
RBC: 4.98 x10E6/uL (ref 4.14–5.80)
RDW: 13.9 % (ref 12.3–15.4)
WBC: 8.7 10*3/uL (ref 3.4–10.8)

## 2017-10-07 LAB — LIPID PANEL
CHOL/HDL RATIO: 9 ratio — AB (ref 0.0–5.0)
CHOLESTEROL TOTAL: 298 mg/dL — AB (ref 100–199)
HDL: 33 mg/dL — ABNORMAL LOW (ref >39)
LDL Calculated: 226 mg/dL — ABNORMAL HIGH (ref 0–99)
Triglycerides: 195 mg/dL — ABNORMAL HIGH (ref 0–149)
VLDL Cholesterol Cal: 39 mg/dL (ref 5–40)

## 2017-10-07 LAB — COMPREHENSIVE METABOLIC PANEL
A/G RATIO: 1.6 (ref 1.2–2.2)
ALT: 15 IU/L (ref 0–44)
AST: 19 IU/L (ref 0–40)
Albumin: 4.7 g/dL (ref 3.5–5.5)
Alkaline Phosphatase: 93 IU/L (ref 39–117)
BUN/Creatinine Ratio: 10 (ref 9–20)
BUN: 9 mg/dL (ref 6–20)
Bilirubin Total: 0.3 mg/dL (ref 0.0–1.2)
CALCIUM: 9.9 mg/dL (ref 8.7–10.2)
CO2: 25 mmol/L (ref 20–29)
CREATININE: 0.89 mg/dL (ref 0.76–1.27)
Chloride: 101 mmol/L (ref 96–106)
GFR, EST AFRICAN AMERICAN: 136 mL/min/{1.73_m2} (ref >59)
GFR, EST NON AFRICAN AMERICAN: 118 mL/min/{1.73_m2} (ref >59)
GLUCOSE: 79 mg/dL (ref 65–99)
Globulin, Total: 3 g/dL (ref 1.5–4.5)
POTASSIUM: 4.1 mmol/L (ref 3.5–5.2)
Sodium: 141 mmol/L (ref 134–144)
TOTAL PROTEIN: 7.7 g/dL (ref 6.0–8.5)

## 2017-10-07 LAB — HIV ANTIBODY (ROUTINE TESTING W REFLEX): HIV SCREEN 4TH GENERATION: NONREACTIVE

## 2017-10-07 LAB — TSH: TSH: 1.61 u[IU]/mL (ref 0.450–4.500)

## 2018-01-06 ENCOUNTER — Ambulatory Visit: Payer: Medicaid Other | Admitting: Family Medicine

## 2018-02-14 ENCOUNTER — Encounter: Payer: Self-pay | Admitting: Family Medicine

## 2018-02-14 ENCOUNTER — Ambulatory Visit: Payer: Medicaid Other | Admitting: Family Medicine

## 2018-02-14 VITALS — BP 126/80 | HR 80 | Temp 98.2°F | Wt 181.0 lb

## 2018-02-14 DIAGNOSIS — E782 Mixed hyperlipidemia: Secondary | ICD-10-CM | POA: Diagnosis not present

## 2018-02-14 NOTE — Progress Notes (Signed)
Patient: Mark Webb Male    DOB: 07-23-1990   26 y.o.   MRN: [id number] Visit Date: 02/14/2018  Today's Provider: Dortha Kern, PA   Chief Complaint  Patient presents with  . Hyperlipidemia   Subjective:    Hyperlipidemia  This is a chronic problem. The problem is uncontrolled. Recent lipid tests were reviewed and are high. Pertinent negatives include no chest pain, focal sensory loss, focal weakness, leg pain, myalgias or shortness of breath. Current antihyperlipidemic treatment includes diet change, exercise and herbal therapy. Compliance problems include medication side effects.    Lab Results  Component Value Date   CHOL 298 (H) 10/06/2017   CHOL 272 (H) 12/09/2015   Lab Results  Component Value Date   HDL 33 (L) 10/06/2017   HDL 36 (L) 12/09/2015   Lab Results  Component Value Date   LDLCALC 226 (H) 10/06/2017   LDLCALC 178 (H) 12/09/2015   Lab Results  Component Value Date   TRIG 195 (H) 10/06/2017   TRIG 288 (H) 12/09/2015   Lab Results  Component Value Date   CHOLHDL 9.0 (H) 10/06/2017   CHOLHDL 7.6 (H) 12/09/2015   No results found for: LDLDIRECT Wt Readings from Last 3 Encounters:  02/14/18 181 lb (82.1 kg)  10/06/17 189 lb (85.7 kg)  07/02/16 167 lb (75.8 kg)      Past Medical History:  Diagnosis Date  . Autism    No past surgical history on file. Family History  Problem Relation Age of Onset  . Healthy Mother   . Hypertension Father   . Healthy Sister   . Healthy Brother   . Hypertension Maternal Grandmother   . Thyroid disease Maternal Grandmother   . Lymphoma Maternal Grandmother   . Arthritis Maternal Grandmother   . COPD Maternal Grandfather   . Hypertension Paternal Grandmother   . Diabetes Paternal Grandfather   . COPD Paternal Grandfather    No Known Allergies  Current Outpatient Medications:  .  Omega-3 Fatty Acids (FISH OIL) 1000 MG CAPS, Take by mouth., Disp: , Rfl:   Review of Systems  Constitutional:  Negative.   Respiratory: Negative.  Negative for shortness of breath.   Cardiovascular: Negative.  Negative for chest pain.  Gastrointestinal: Negative.   Musculoskeletal: Negative.  Negative for myalgias.  Neurological: Negative for dizziness, focal weakness, light-headedness and headaches.   Social History   Tobacco Use  . Smoking status: Never Smoker  . Smokeless tobacco: Never Used  Substance Use Topics  . Alcohol use: Yes    Alcohol/week: 0.0 standard drinks   Objective:   BP 126/80 (BP Location: Right Arm, Patient Position: Sitting, Cuff Size: Normal)   Pulse 80   Temp 98.2 F (36.8 C) (Oral)   Wt 181 lb (82.1 kg)   SpO2 98%   BMI 27.52 kg/m  Vitals:   02/14/18 0845  BP: 126/80  Pulse: 80  Temp: 98.2 F (36.8 C)  TempSrc: Oral  SpO2: 98%  Weight: 181 lb (82.1 kg)   Physical Exam  Constitutional: He is oriented to person, place, and time. He appears well-developed and well-nourished. No distress.  HENT:  Head: Normocephalic and atraumatic.  Right Ear: Hearing normal.  Left Ear: Hearing normal.  Nose: Nose normal.  Eyes: Conjunctivae and lids are normal. Right eye exhibits no discharge. Left eye exhibits no discharge. No scleral icterus.  Neck: Neck supple.  Cardiovascular: Normal rate and regular rhythm.  Pulmonary/Chest: Effort normal and breath sounds  normal. No respiratory distress.  Abdominal: Soft. Bowel sounds are normal.  Musculoskeletal: Normal range of motion.  Neurological: He is alert and oriented to person, place, and time.  Skin: Skin is intact. No lesion and no rash noted.  Psychiatric: He has a normal mood and affect. His speech is normal and behavior is normal. Thought content normal.      Assessment & Plan:     1. Mixed hyperlipidemia Presents with grandmother and very quiet (not much interaction due to autism). Has lost 8 lbs since April 2019. Will recheck labs and give a DASH diet to follow. - Comprehensive metabolic panel - Lipid  panel       Dortha Kern, PA  [facility]

## 2018-02-14 NOTE — Patient Instructions (Signed)
DASH Eating Plan DASH stands for "Dietary Approaches to Stop Hypertension." The DASH eating plan is a healthy eating plan that has been shown to reduce high blood pressure (hypertension). It may also reduce your risk for type 2 diabetes, heart disease, and stroke. The DASH eating plan may also help with weight loss. What are tips for following this plan? General guidelines  Avoid eating more than 2,300 mg (milligrams) of salt (sodium) a day. If you have hypertension, you may need to reduce your sodium intake to 1,500 mg a day.  Limit alcohol intake to no more than 1 drink a day for nonpregnant women and 2 drinks a day for men. One drink equals 12 oz of beer, 5 oz of wine, or 1 oz of hard liquor.  Work with your health care provider to maintain a healthy body weight or to lose weight. Ask what an ideal weight is for you.  Get at least 30 minutes of exercise that causes your heart to beat faster (aerobic exercise) most days of the week. Activities may include walking, swimming, or biking.  Work with your health care provider or diet and nutrition specialist (dietitian) to adjust your eating plan to your individual calorie needs. Reading food labels  Check food labels for the amount of sodium per serving. Choose foods with less than 5 percent of the Daily Value of sodium. Generally, foods with less than 300 mg of sodium per serving fit into this eating plan.  To find whole grains, look for the word "whole" as the first word in the ingredient list. Shopping  Buy products labeled as "low-sodium" or "no salt added."  Buy fresh foods. Avoid canned foods and premade or frozen meals. Cooking  Avoid adding salt when cooking. Use salt-free seasonings or herbs instead of table salt or sea salt. Check with your health care provider or pharmacist before using salt substitutes.  Do not fry foods. Cook foods using healthy methods such as baking, boiling, grilling, and broiling instead.  Cook with  heart-healthy oils, such as olive, canola, soybean, or sunflower oil. Meal planning   Eat a balanced diet that includes: ? 5 or more servings of fruits and vegetables each day. At each meal, try to fill half of your plate with fruits and vegetables. ? Up to 6-8 servings of whole grains each day. ? Less than 6 oz of lean meat, poultry, or fish each day. A 3-oz serving of meat is about the same size as a deck of cards. One egg equals 1 oz. ? 2 servings of low-fat dairy each day. ? A serving of nuts, seeds, or beans 5 times each week. ? Heart-healthy fats. Healthy fats called Omega-3 fatty acids are found in foods such as flaxseeds and coldwater fish, like sardines, salmon, and mackerel.  Limit how much you eat of the following: ? Canned or prepackaged foods. ? Food that is high in trans fat, such as fried foods. ? Food that is high in saturated fat, such as fatty meat. ? Sweets, desserts, sugary drinks, and other foods with added sugar. ? Full-fat dairy products.  Do not salt foods before eating.  Try to eat at least 2 vegetarian meals each week.  Eat more home-cooked food and less restaurant, buffet, and fast food.  When eating at a restaurant, ask that your food be prepared with less salt or no salt, if possible. What foods are recommended? The items listed may not be a complete list. Talk with your dietitian about what   dietary choices are best for you. Grains Whole-grain or whole-wheat bread. Whole-grain or whole-wheat pasta. Brown rice. Oatmeal. Quinoa. Bulgur. Whole-grain and low-sodium cereals. Pita bread. Low-fat, low-sodium crackers. Whole-wheat flour tortillas. Vegetables Fresh or frozen vegetables (raw, steamed, roasted, or grilled). Low-sodium or reduced-sodium tomato and vegetable juice. Low-sodium or reduced-sodium tomato sauce and tomato paste. Low-sodium or reduced-sodium canned vegetables. Fruits All fresh, dried, or frozen fruit. Canned fruit in natural juice (without  added sugar). Meat and other protein foods Skinless chicken or turkey. Ground chicken or turkey. Pork with fat trimmed off. Fish and seafood. Egg whites. Dried beans, peas, or lentils. Unsalted nuts, nut butters, and seeds. Unsalted canned beans. Lean cuts of beef with fat trimmed off. Low-sodium, lean deli meat. Dairy Low-fat (1%) or fat-free (skim) milk. Fat-free, low-fat, or reduced-fat cheeses. Nonfat, low-sodium ricotta or cottage cheese. Low-fat or nonfat yogurt. Low-fat, low-sodium cheese. Fats and oils Soft margarine without trans fats. Vegetable oil. Low-fat, reduced-fat, or light mayonnaise and salad dressings (reduced-sodium). Canola, safflower, olive, soybean, and sunflower oils. Avocado. Seasoning and other foods Herbs. Spices. Seasoning mixes without salt. Unsalted popcorn and pretzels. Fat-free sweets. What foods are not recommended? The items listed may not be a complete list. Talk with your dietitian about what dietary choices are best for you. Grains Baked goods made with fat, such as croissants, muffins, or some breads. Dry pasta or rice meal packs. Vegetables Creamed or fried vegetables. Vegetables in a cheese sauce. Regular canned vegetables (not low-sodium or reduced-sodium). Regular canned tomato sauce and paste (not low-sodium or reduced-sodium). Regular tomato and vegetable juice (not low-sodium or reduced-sodium). Pickles. Olives. Fruits Canned fruit in a light or heavy syrup. Fried fruit. Fruit in cream or butter sauce. Meat and other protein foods Fatty cuts of meat. Ribs. Fried meat. Bacon. Sausage. Bologna and other processed lunch meats. Salami. Fatback. Hotdogs. Bratwurst. Salted nuts and seeds. Canned beans with added salt. Canned or smoked fish. Whole eggs or egg yolks. Chicken or turkey with skin. Dairy Whole or 2% milk, cream, and half-and-half. Whole or full-fat cream cheese. Whole-fat or sweetened yogurt. Full-fat cheese. Nondairy creamers. Whipped toppings.  Processed cheese and cheese spreads. Fats and oils Butter. Stick margarine. Lard. Shortening. Ghee. Bacon fat. Tropical oils, such as coconut, palm kernel, or palm oil. Seasoning and other foods Salted popcorn and pretzels. Onion salt, garlic salt, seasoned salt, table salt, and sea salt. Worcestershire sauce. Tartar sauce. Barbecue sauce. Teriyaki sauce. Soy sauce, including reduced-sodium. Steak sauce. Canned and packaged gravies. Fish sauce. Oyster sauce. Cocktail sauce. Horseradish that you find on the shelf. Ketchup. Mustard. Meat flavorings and tenderizers. Bouillon cubes. Hot sauce and Tabasco sauce. Premade or packaged marinades. Premade or packaged taco seasonings. Relishes. Regular salad dressings. Where to find more information:  National Heart, Lung, and Blood Institute: www.nhlbi.nih.gov  American Heart Association: www.heart.org Summary  The DASH eating plan is a healthy eating plan that has been shown to reduce high blood pressure (hypertension). It may also reduce your risk for type 2 diabetes, heart disease, and stroke.  With the DASH eating plan, you should limit salt (sodium) intake to 2,300 mg a day. If you have hypertension, you may need to reduce your sodium intake to 1,500 mg a day.  When on the DASH eating plan, aim to eat more fresh fruits and vegetables, whole grains, lean proteins, low-fat dairy, and heart-healthy fats.  Work with your health care provider or diet and nutrition specialist (dietitian) to adjust your eating plan to your individual   calorie needs. This information is not intended to replace advice given to you by your health care provider. Make sure you discuss any questions you have with your health care provider. Document Released: 05/20/2011 Document Revised: 05/24/2016 Document Reviewed: 05/24/2016 Elsevier Interactive Patient Education  2018 Elsevier Inc.  

## 2018-02-15 LAB — COMPREHENSIVE METABOLIC PANEL
A/G RATIO: 1.6 (ref 1.2–2.2)
ALT: 21 IU/L (ref 0–44)
AST: 18 IU/L (ref 0–40)
Albumin: 4.5 g/dL (ref 3.5–5.5)
Alkaline Phosphatase: 82 IU/L (ref 39–117)
BUN/Creatinine Ratio: 13 (ref 9–20)
BUN: 12 mg/dL (ref 6–20)
Bilirubin Total: 0.2 mg/dL (ref 0.0–1.2)
CALCIUM: 9.9 mg/dL (ref 8.7–10.2)
CHLORIDE: 102 mmol/L (ref 96–106)
CO2: 23 mmol/L (ref 20–29)
Creatinine, Ser: 0.93 mg/dL (ref 0.76–1.27)
GFR, EST AFRICAN AMERICAN: 131 mL/min/{1.73_m2} (ref >59)
GFR, EST NON AFRICAN AMERICAN: 113 mL/min/{1.73_m2} (ref >59)
GLOBULIN, TOTAL: 2.8 g/dL (ref 1.5–4.5)
Glucose: 87 mg/dL (ref 65–99)
POTASSIUM: 4.5 mmol/L (ref 3.5–5.2)
SODIUM: 140 mmol/L (ref 134–144)
TOTAL PROTEIN: 7.3 g/dL (ref 6.0–8.5)

## 2018-02-15 LAB — LIPID PANEL
Chol/HDL Ratio: 6.9 ratio — ABNORMAL HIGH (ref 0.0–5.0)
Cholesterol, Total: 291 mg/dL — ABNORMAL HIGH (ref 100–199)
HDL: 42 mg/dL (ref >39)
LDL Calculated: 219 mg/dL — ABNORMAL HIGH (ref 0–99)
Triglycerides: 148 mg/dL (ref 0–149)
VLDL Cholesterol Cal: 30 mg/dL (ref 5–40)

## 2018-02-16 ENCOUNTER — Telehealth: Payer: Self-pay

## 2018-02-16 NOTE — Telephone Encounter (Signed)
-----   Message from Tamsen Roers, [place] sent at 02/16/2018  8:40 AM EDT ----- Blood tests are normal except LDL cholesterol (the"bad"one). This is a little better than 4 months ago. Recommend working on a low fat diet and starting Krill Oil 1000 mg qd then recheck levels in 3 months.

## 2018-02-16 NOTE — Telephone Encounter (Signed)
Pt's Mom Kaylub Carrazco (On DPR) advised.  Apt schedule for 06/19/2018.  Thanks,   -Vernona Rieger

## 2018-06-19 ENCOUNTER — Ambulatory Visit: Payer: Medicaid Other | Admitting: Family Medicine

## 2018-06-30 NOTE — Progress Notes (Deleted)
       Patient: Mark Webb Male    DOB: 08-25-1990   28 y.o.   MRN: [id number] Visit Date: 06/30/2018  Today's Provider: Dortha Kern, PA   No chief complaint on file.  Subjective:     HPI   Lipid/Cholesterol, Follow-up:   Last seen for this4 months ago.  Management changes since that visit include Recommend working on a low fat diet and starting Krill Oil 1000 mg qd . Last Lipid Panel:    Component Value Date/Time   CHOL 291 (H) 02/14/2018 0940   TRIG 148 02/14/2018 0940   HDL 42 02/14/2018 0940   CHOLHDL 6.9 (H) 02/14/2018 0940   LDLCALC 219 (H) 02/14/2018 0940    He reports {excellent/good/fair/poor:19665} compliance with treatment. He {ACTION; IS/IS YPP:50932671} having side effects.  Current symptoms include {Symptoms; diabetes:14075} and have been {Desc; course:15616}. Weight trend: {trend:16658} Prior visit with dietician: {yes/no:17258} Current diet: {diet habits:16563} Current exercise: {exercise types:16438}  Wt Readings from Last 3 Encounters:  02/14/18 181 lb (82.1 kg)  10/06/17 189 lb (85.7 kg)  07/02/16 167 lb (75.8 kg)    -------------------------------------------------------------------   No Known Allergies   Current Outpatient Medications:  .  Omega-3 Fatty Acids (FISH OIL) 1000 MG CAPS, Take by mouth., Disp: , Rfl:   Review of Systems  Social History   Tobacco Use  . Smoking status: Never Smoker  . Smokeless tobacco: Never Used  Substance Use Topics  . Alcohol use: Yes    Alcohol/week: 0.0 standard drinks      Objective:   There were no vitals taken for this visit. There were no vitals filed for this visit.   Physical Exam      Assessment & Plan        Dortha Kern, PA  [facility]

## 2018-07-03 ENCOUNTER — Ambulatory Visit: Payer: Medicaid Other | Admitting: Family Medicine

## 2018-08-12 ENCOUNTER — Ambulatory Visit
Admission: RE | Admit: 2018-08-12 | Discharge: 2018-08-12 | Disposition: A | Discharge status: Home / Self Care | Payer: Medicaid Other | Source: Ambulatory Visit | Attending: Family Medicine | Admitting: Family Medicine

## 2018-08-12 ENCOUNTER — Encounter: Payer: Self-pay | Admitting: Family Medicine

## 2018-08-12 ENCOUNTER — Ambulatory Visit: Payer: Medicaid Other | Admitting: Family Medicine

## 2018-08-12 VITALS — BP 114/90 | HR 91 | Temp 98.4°F | Resp 16 | Wt 184.6 lb

## 2018-08-12 DIAGNOSIS — R05 Cough: Secondary | ICD-10-CM | POA: Diagnosis present

## 2018-08-12 DIAGNOSIS — R059 Cough, unspecified: Secondary | ICD-10-CM

## 2018-08-12 NOTE — Patient Instructions (Signed)
Cough, Adult  Coughing is a reflex that clears your throat and your airways. Coughing helps to heal and protect your lungs. It is normal to cough occasionally, but a cough that happens with other symptoms or lasts a long time may be a sign of a condition that needs treatment. A cough may last only 2-3 weeks (acute), or it may last longer than 8 weeks (chronic). What are the causes? Coughing is commonly caused by:  Breathing in substances that irritate your lungs.  A viral or bacterial respiratory infection.  Allergies.  Asthma.  Postnasal drip.  Smoking.  Acid backing up from the stomach into the esophagus (gastroesophageal reflux).  Certain medicines.  Chronic lung problems, including COPD (or rarely, lung cancer).  Other medical conditions such as heart failure. Follow these instructions at home: Pay attention to any changes in your symptoms. Take these actions to help with your discomfort:  Take medicines only as told by your health care provider. ? If you were prescribed an antibiotic medicine, take it as told by your health care provider. Do not stop taking the antibiotic even if you start to feel better. ? Talk with your health care provider before you take a cough suppressant medicine.  Drink enough fluid to keep your urine clear or pale yellow.  If the air is dry, use a cold steam vaporizer or humidifier in your bedroom or your home to help loosen secretions.  Avoid anything that causes you to cough at work or at home.  If your cough is worse at night, try sleeping in a semi-upright position.  Avoid cigarette smoke. If you smoke, quit smoking. If you need help quitting, ask your health care provider.  Avoid caffeine.  Avoid alcohol.  Rest as needed. Contact a health care provider if:  You have new symptoms.  You cough up pus.  Your cough does not get better after 2-3 weeks, or your cough gets worse.  You cannot control your cough with suppressant  medicines and you are losing sleep.  You develop pain that is getting worse or pain that is not controlled with pain medicines.  You have a fever.  You have unexplained weight loss.  You have night sweats. Get help right away if:  You cough up blood.  You have difficulty breathing.  Your heartbeat is very fast. This information is not intended to replace advice given to you by your health care provider. Make sure you discuss any questions you have with your health care provider. Document Released: 11/27/2010 Document Revised: 11/06/2015 Document Reviewed: 08/07/2014 Elsevier Interactive Patient Education  2019 Elsevier Inc.  

## 2018-08-12 NOTE — Progress Notes (Signed)
Patient: Mark Webb Male    DOB: 11-Sep-1990   28 y.o.   MRN: [id number] Visit Date: 08/12/2018  Today's Provider: Shirlee Latch, MD   Chief Complaint  Patient presents with  . Cough   Subjective:     Cough  This is a new problem. The current episode started 1 to 4 weeks ago. The problem has been gradually worsening. The problem occurs constantly. The cough is productive of sputum. Associated symptoms include headaches, postnasal drip and a sore throat. Pertinent negatives include no chest pain, chills, ear congestion, ear pain, fever, heartburn, hemoptysis, myalgias, nasal congestion, rash, rhinorrhea, shortness of breath, sweats, weight loss or wheezing. Treatments tried: Mucinex, Vicks Vapor Rub, Promethazine DM. The treatment provided mild relief. His past medical history is significant for pneumonia. There is no history of asthma or bronchitis.   Present for about 7 days.  They are concerned due to his history of pneumonia.  They deny any fevers.  Mother was sick with similar symptoms prior to him getting sick   No Known Allergies   Current Outpatient Medications:  .  Omega-3 Fatty Acids (FISH OIL) 1000 MG CAPS, Take by mouth., Disp: , Rfl:   Review of Systems  Constitutional: Negative for chills, fever and weight loss.  HENT: Positive for postnasal drip and sore throat. Negative for ear pain and rhinorrhea.   Respiratory: Positive for cough. Negative for hemoptysis, shortness of breath and wheezing.   Cardiovascular: Negative for chest pain.  Gastrointestinal: Negative for heartburn.  Musculoskeletal: Negative for myalgias.  Skin: Negative for rash.  Neurological: Positive for headaches.    Social History   Tobacco Use  . Smoking status: Never Smoker  . Smokeless tobacco: Never Used  Substance Use Topics  . Alcohol use: Yes    Alcohol/week: 0.0 standard drinks      Objective:   BP 114/90   Pulse 91   Temp 98.4 F (36.9 C) (Oral)   Resp 16    Wt 184 lb 9.6 oz (83.7 kg)   SpO2 97%   BMI 28.07 kg/m  Vitals:   08/12/18 1039  BP: 114/90  Pulse: 91  Resp: 16  Temp: 98.4 F (36.9 C)  TempSrc: Oral  SpO2: 97%  Weight: 184 lb 9.6 oz (83.7 kg)     Physical Exam Vitals signs reviewed.  Constitutional:      General: He is not in acute distress.    Appearance: Normal appearance. He is not diaphoretic.  HENT:     Head: Normocephalic and atraumatic.     Right Ear: Tympanic membrane, ear canal and external ear normal.     Left Ear: Tympanic membrane, ear canal and external ear normal.     Nose: Congestion present. No rhinorrhea.     Mouth/Throat:     Mouth: Mucous membranes are moist.     Pharynx: Oropharynx is clear. No oropharyngeal exudate or posterior oropharyngeal erythema.  Eyes:     General: No scleral icterus.    Conjunctiva/sclera: Conjunctivae normal.     Pupils: Pupils are equal, round, and reactive to light.  Neck:     Musculoskeletal: Neck supple.  Cardiovascular:     Rate and Rhythm: Normal rate and regular rhythm.     Pulses: Normal pulses.     Heart sounds: Normal heart sounds. No murmur.  Pulmonary:     Effort: Pulmonary effort is normal. No respiratory distress.     Comments: Isolated crackles in R base Abdominal:  General: There is no distension.     Palpations: Abdomen is soft.     Tenderness: There is no abdominal tenderness.  Musculoskeletal:     Right lower leg: No edema.     Left lower leg: No edema.  Lymphadenopathy:     Cervical: No cervical adenopathy.  Skin:    General: Skin is warm and dry.     Capillary Refill: Capillary refill takes less than 2 seconds.     Findings: No rash.  Neurological:     Mental Status: He is alert. Mental status is at baseline.        Assessment & Plan   1. Cough - new problem - no evidence of strep pharyngitis, AOM, bacterial sinusitis, or other bacterial infection - likely viral URI with possible bronchitic changes - given h/o CAP, will check  CXR - discussed symptomatic management, natural course, and return precautions  - will treat otherwise if there is something on CXR - DG Chest 2 View; Future    Return if symptoms worsen or fail to improve.   The entirety of the information documented in the History of Present Illness, Review of Systems and Physical Exam were personally obtained by me. Portions of this information were initially documented by Nadene Rubins, CMA and reviewed by me for thoroughness and accuracy.    Erasmo Downer, MD, MPH [facility] 08/12/2018 11:04 AM

## 2018-08-14 ENCOUNTER — Other Ambulatory Visit: Payer: Self-pay | Admitting: Family Medicine

## 2018-08-14 ENCOUNTER — Telehealth: Payer: Self-pay

## 2018-08-14 MED ORDER — AZITHROMYCIN 250 MG PO TABS: ORAL_TABLET | ORAL | 0 refills | Status: DC

## 2018-08-14 NOTE — Telephone Encounter (Signed)
Only if he is not getting better in 1 wk.

## 2018-08-14 NOTE — Telephone Encounter (Signed)
Patient mother Mark Webb was advised. Mom would like to know if patient needs to come back in for a follow up visit.

## 2018-08-14 NOTE — Telephone Encounter (Signed)
-----   Message from Erasmo Downer, MD sent at 08/14/2018  8:47 AM EST ----- No clear pneumonia, but there is something noted in R lung base that could be scarring or pneumonia.  As patient is symptomatic, will start empiric treat with azithromycin. Rx sent to pharmacy

## 2018-08-14 NOTE — Telephone Encounter (Signed)
Patient mother Shuichi Olbrich was advised.

## 2018-09-26 ENCOUNTER — Ambulatory Visit (INDEPENDENT_AMBULATORY_CARE_PROVIDER_SITE_OTHER): Payer: Medicaid Other | Admitting: Family Medicine

## 2018-09-26 ENCOUNTER — Other Ambulatory Visit: Payer: Self-pay

## 2018-09-26 ENCOUNTER — Encounter: Payer: Self-pay | Admitting: Family Medicine

## 2018-09-26 VITALS — BP 119/78 | HR 94 | Temp 98.6°F | Wt 183.8 lb

## 2018-09-26 DIAGNOSIS — R1010 Upper abdominal pain, unspecified: Secondary | ICD-10-CM | POA: Diagnosis not present

## 2018-09-26 LAB — POCT URINALYSIS DIPSTICK
Bilirubin, UA: NEGATIVE
Blood, UA: NEGATIVE
Glucose, UA: NEGATIVE
Ketones, UA: NEGATIVE
Leukocytes, UA: NEGATIVE
Nitrite, UA: NEGATIVE
Protein, UA: NEGATIVE
Spec Grav, UA: 1.02 (ref 1.010–1.025)
Urobilinogen, UA: 0.2 E.U./dL
pH, UA: 6 (ref 5.0–8.0)

## 2018-09-26 NOTE — Progress Notes (Signed)
Patient: Mark Webb Male    DOB: 03/15/1991   28 y.o.   MRN: [id number] Visit Date: 09/26/2018  Today's Provider: Dortha Kern, PA   Chief Complaint  Patient presents with  . Abdominal Pain   Subjective:     Abdominal Pain  This is a new problem. Episode onset: Sunday. The onset quality is sudden. The problem has been unchanged. The pain is located in the right flank. The quality of the pain is sharp. The abdominal pain radiates to the epigastric region. Treatments tried: Ibuprofen. The treatment provided mild relief.   Past Medical History:  Diagnosis Date  . Autism    No past surgical history on file.   Family History  Problem Relation Age of Onset  . Healthy Mother   . Hypertension Father   . Healthy Sister   . Healthy Brother   . Hypertension Maternal Grandmother   . Thyroid disease Maternal Grandmother   . Lymphoma Maternal Grandmother   . Arthritis Maternal Grandmother   . COPD Maternal Grandfather   . Hypertension Paternal Grandmother   . Diabetes Paternal Grandfather   . COPD Paternal Grandfather    No Known Allergies  Current Outpatient Medications:  .  Omega-3 Fatty Acids (FISH OIL) 1000 MG CAPS, Take by mouth., Disp: , Rfl:   Review of Systems  Constitutional: Negative.   Respiratory: Negative.   Cardiovascular: Negative.   Gastrointestinal: Positive for abdominal pain.  Musculoskeletal: Negative.    Social History   Tobacco Use  . Smoking status: Never Smoker  . Smokeless tobacco: Never Used  Substance Use Topics  . Alcohol use: Yes    Alcohol/week: 0.0 standard drinks     Objective:   BP 119/78 (BP Location: Right Arm, Patient Position: Sitting, Cuff Size: Large)   Pulse 94   Temp 98.6 F (37 C) (Oral)   Wt 183 lb 12.8 oz (83.4 kg)   SpO2 99%   BMI 27.95 kg/m  Vitals:   09/26/18 1531  BP: 119/78  Pulse: 94  Temp: 98.6 F (37 C)  TempSrc: Oral  SpO2: 99%  Weight: 183 lb 12.8 oz (83.4 kg)   Physical Exam  Constitutional:      General: He is not in acute distress.    Appearance: He is well-developed.  HENT:     Head: Normocephalic and atraumatic.     Right Ear: Hearing normal.     Left Ear: Hearing normal.     Nose: Nose normal.  Eyes:     General: Lids are normal. No scleral icterus.       Right eye: No discharge.        Left eye: No discharge.     Conjunctiva/sclera: Conjunctivae normal.  Cardiovascular:     Rate and Rhythm: Normal rate and regular rhythm.     Heart sounds: Normal heart sounds.  Pulmonary:     Effort: Pulmonary effort is normal. No respiratory distress.  Abdominal:     General: Abdomen is flat. There is no distension.     Palpations: Abdomen is soft. There is no mass.     Tenderness: There is generalized abdominal tenderness. There is no guarding. Negative signs include psoas sign.     Hernia: No hernia is present.  Musculoskeletal: Normal range of motion.  Skin:    Findings: No lesion or rash.  Neurological:     Mental Status: He is alert and oriented to person, place, and time.  Psychiatric:  Speech: Speech normal.        Thought Content: Thought content normal.     Comments: Autistic and very shy. Slow and quiet communication.       Assessment & Plan    1. Pain of upper abdomen Started having right abdomen discomfort on 09-24-18 and progressed to epigastric with loose stools. Having left abdomen tenderness today but no fever, hematochezia, nausea or vomiting. Will check CBC and CMP. May use Pepto-Bismol prn loose stools and cramping with bland diet and extra fluids. If fever develops or worsening abdominal pain, may need to go to the ER. Suspected gastroenteritis with diarrhea. - CBC with Differential/Platelet - Comprehensive metabolic panel - POCT urinalysis dipstick     Dortha Kernennis Chrismon, PA  [facility]

## 2018-09-27 LAB — CBC WITH DIFFERENTIAL/PLATELET
Basophils Absolute: 0.1 10*3/uL (ref 0.0–0.2)
Basos: 1 %
EOS (ABSOLUTE): 0.1 10*3/uL (ref 0.0–0.4)
Eos: 2 %
Hematocrit: 43.7 % (ref 37.5–51.0)
Hemoglobin: 14.9 g/dL (ref 13.0–17.7)
Immature Grans (Abs): 0 10*3/uL (ref 0.0–0.1)
Immature Granulocytes: 0 %
Lymphocytes Absolute: 2.6 10*3/uL (ref 0.7–3.1)
Lymphs: 34 %
MCH: 28.8 pg (ref 26.6–33.0)
MCHC: 34.1 g/dL (ref 31.5–35.7)
MCV: 85 fL (ref 79–97)
Monocytes Absolute: 0.7 10*3/uL (ref 0.1–0.9)
Monocytes: 9 %
Neutrophils Absolute: 4.1 10*3/uL (ref 1.4–7.0)
Neutrophils: 54 %
Platelets: 340 10*3/uL (ref 150–450)
RBC: 5.17 x10E6/uL (ref 4.14–5.80)
RDW: 13 % (ref 11.6–15.4)
WBC: 7.5 10*3/uL (ref 3.4–10.8)

## 2018-09-27 LAB — COMPREHENSIVE METABOLIC PANEL
ALT: 17 IU/L (ref 0–44)
AST: 18 IU/L (ref 0–40)
Albumin/Globulin Ratio: 1.6 (ref 1.2–2.2)
Albumin: 4.5 g/dL (ref 4.1–5.2)
Alkaline Phosphatase: 70 IU/L (ref 39–117)
BUN/Creatinine Ratio: 10 (ref 9–20)
BUN: 9 mg/dL (ref 6–20)
Bilirubin Total: 0.2 mg/dL (ref 0.0–1.2)
CO2: 21 mmol/L (ref 20–29)
Calcium: 9.8 mg/dL (ref 8.7–10.2)
Chloride: 105 mmol/L (ref 96–106)
Creatinine, Ser: 0.86 mg/dL (ref 0.76–1.27)
GFR calc Af Amer: 137 mL/min/{1.73_m2} (ref >59)
GFR calc non Af Amer: 119 mL/min/{1.73_m2} (ref >59)
Globulin, Total: 2.8 g/dL (ref 1.5–4.5)
Glucose: 91 mg/dL (ref 65–99)
Potassium: 4.4 mmol/L (ref 3.5–5.2)
Sodium: 141 mmol/L (ref 134–144)
Total Protein: 7.3 g/dL (ref 6.0–8.5)

## 2018-09-27 LAB — SPECIMEN STATUS REPORT

## 2018-10-03 ENCOUNTER — Ambulatory Visit: Payer: Self-pay | Admitting: Family Medicine

## 2018-11-08 ENCOUNTER — Telehealth: Payer: Self-pay

## 2018-11-08 NOTE — Telephone Encounter (Signed)
Unable to leave voicemail regarding PHQ-2 

## 2019-03-13 ENCOUNTER — Other Ambulatory Visit: Payer: Self-pay

## 2019-03-13 ENCOUNTER — Encounter: Payer: Self-pay | Admitting: Family Medicine

## 2019-03-13 ENCOUNTER — Ambulatory Visit (INDEPENDENT_AMBULATORY_CARE_PROVIDER_SITE_OTHER): Payer: Medicaid Other | Admitting: Family Medicine

## 2019-03-13 VITALS — BP 121/72 | HR 101

## 2019-03-13 DIAGNOSIS — F840 Autistic disorder: Secondary | ICD-10-CM

## 2019-03-13 DIAGNOSIS — R519 Headache, unspecified: Secondary | ICD-10-CM

## 2019-03-13 DIAGNOSIS — R51 Headache: Secondary | ICD-10-CM

## 2019-03-13 DIAGNOSIS — G8929 Other chronic pain: Secondary | ICD-10-CM

## 2019-03-13 NOTE — Progress Notes (Signed)
EVENS MENO  MRN: [id number] DOB: 1990/06/18 Virtual Visit via Telephone Note  I connected with Mark Webb on 03/13/19 at  1:20 PM EDT by telephone and verified that I am speaking with the correct person (patient's mother) using two identifiers.  Location: Patient: Home Provider: Office   I discussed the limitations, risks, security and privacy concerns of performing an evaluation and management service by telephone and the availability of in person appointments. I also discussed with the patient that there may be a patient responsible charge related to this service. The patient expressed understanding and agreed to proceed.  Subjective:  HPI   The patient is a 28 year old male with autism whose mother presents today via phone visit.  She will be conducting the visit today for her son.  She states that he complains of "chronic" headaches.  He has been having headaches off and on for several years.    His headaches are lasting longer than usual and are coming more often.  He is getting them 2-3 times each week. There is no pattern to the time of day or day of the week that he gets the headaches.    His mother states that he describes them as feeling like the top of his head is very hot.  His mother states that when she felt the top of his head there is one area that does feel than the rest of his head.  She states that the patient will put his head under the faucett with ice cold water and let it run over his head to cool it off.   His mother reports that he does sometimes drink beverages with caffeine.  However, she thinks they may be some type of migraine headache.  She is concerned because they have gone on for so many years and they seem to be worsening.  She would like to have some testing done for peace of mind that there is not anything more serious going on with the patient.   She also mentioned that she is concerned that he may have some problems with a little high blood  pressure.  She is unsure of what his blood pressure runs but after discussing places and ways of getting it checked, she will try to get it especially when he is having the headaches.    Patient Active Problem List   Diagnosis Date Noted  . Constipation 08/05/2016  . Active autistic disorder 01/21/2015  . Generalized headache 01/21/2015  . Migraine 01/21/2015   Past Medical History:  Diagnosis Date  . Autism    No past surgical history on file.  Family History  Problem Relation Age of Onset  . Healthy Mother   . Hypertension Father   . Healthy Sister   . Healthy Brother   . Hypertension Maternal Grandmother   . Thyroid disease Maternal Grandmother   . Lymphoma Maternal Grandmother   . Arthritis Maternal Grandmother   . COPD Maternal Grandfather   . Hypertension Paternal Grandmother   . Diabetes Paternal Grandfather   . COPD Paternal Grandfather    Social History   Socioeconomic History  . Marital status: Single    Spouse name: Not on file  . Number of children: Not on file  . Years of education: Not on file  . Highest education level: Not on file  Occupational History  . Not on file  Social Needs  . Financial resource strain: Not on file  . Food insecurity  Worry: Not on file    Inability: Not on file  . Transportation needs    Medical: Not on file    Non-medical: Not on file  Tobacco Use  . Smoking status: Never Smoker  . Smokeless tobacco: Never Used  Substance and Sexual Activity  . Alcohol use: Yes    Alcohol/week: 0.0 standard drinks  . Drug use: No  . Sexual activity: Not on file  Lifestyle  . Physical activity    Days per week: Not on file    Minutes per session: Not on file  . Stress: Not on file  Relationships  . Social Herbalist on phone: Not on file    Gets together: Not on file    Attends religious service: Not on file    Active member of club or organization: Not on file    Attends meetings of clubs or organizations: Not on  file    Relationship status: Not on file  . Intimate partner violence    Fear of current or ex partner: Not on file    Emotionally abused: Not on file    Physically abused: Not on file    Forced sexual activity: Not on file  Other Topics Concern  . Not on file  Social History Narrative  . Not on file   Outpatient Encounter Medications as of 03/13/2019  Medication Sig  . Omega-3 Fatty Acids (FISH OIL) 1000 MG CAPS Take by mouth.   No facility-administered encounter medications on file as of 03/13/2019.    No Known Allergies  Review of Systems  Constitutional: Negative for fever and malaise/fatigue.  HENT: Negative for congestion and sore throat.   Respiratory: Negative for cough.   Neurological: Positive for headaches.    Objective:  BP 121/72   Pulse (!) 101   Patient is autistic and does not communicate much. Interview with mother who got his BP and pulse today.  Assessment and Plan :   1. Chronic nonintractable headache, unspecified headache type Has had an increase in headaches/migraines over theap st several months. Complains of a headache in the top of head with a sensation of heat in that area to palpation. Three Advil and rest in a dark room will stop the headache in an hour. They seem to have increased to 2 headaches a week now. No nausea, vomiting or apparent sensitivity to light or sound. Difficult to get accurate history with his degree of autism. Mother most concerned about any intracranial disease with increase in frequency of headaches. Will get CBC, CMP, TSH and sedrate. May need CT scan and/or referral to a neurologist pending reports. - CBC with Differential/Platelet - Comprehensive metabolic panel - TSH - Sedimentation rate  2. Active autistic disorder Poor interaction and social abilities with others. Makes accurate communication difficult. Diagnose with autism as a child. Due for general labs and may need further evaluation of headaches. - CBC with  Differential/Platelet - Comprehensive metabolic panel - TSH  Follow Up Instructions:  I discussed the assessment and treatment plan with the patient. The patient was provided an opportunity to ask questions and all were answered. The patient agreed with the plan and demonstrated an understanding of the instructions.   The patient was advised to call back or seek an in-person evaluation if the symptoms worsen or if the condition fails to improve as anticipated.  I provided 15 minutes of non-face-to-face time during this encounter.   Vernie Murders, PA

## 2019-03-15 LAB — COMPREHENSIVE METABOLIC PANEL
ALT: 14 IU/L (ref 0–44)
AST: 16 IU/L (ref 0–40)
Albumin/Globulin Ratio: 1.7 (ref 1.2–2.2)
Albumin: 4.6 g/dL (ref 4.1–5.2)
Alkaline Phosphatase: 71 IU/L (ref 39–117)
BUN/Creatinine Ratio: 8 — ABNORMAL LOW (ref 9–20)
BUN: 8 mg/dL (ref 6–20)
Bilirubin Total: 0.4 mg/dL (ref 0.0–1.2)
CO2: 23 mmol/L (ref 20–29)
Calcium: 9.8 mg/dL (ref 8.7–10.2)
Chloride: 104 mmol/L (ref 96–106)
Creatinine, Ser: 1.06 mg/dL (ref 0.76–1.27)
GFR calc Af Amer: 111 mL/min/{1.73_m2} (ref >59)
GFR calc non Af Amer: 96 mL/min/{1.73_m2} (ref >59)
Globulin, Total: 2.7 g/dL (ref 1.5–4.5)
Glucose: 89 mg/dL (ref 65–99)
Potassium: 4.3 mmol/L (ref 3.5–5.2)
Sodium: 141 mmol/L (ref 134–144)
Total Protein: 7.3 g/dL (ref 6.0–8.5)

## 2019-03-15 LAB — CBC WITH DIFFERENTIAL/PLATELET
Basophils Absolute: 0.1 10*3/uL (ref 0.0–0.2)
Basos: 1 %
EOS (ABSOLUTE): 0.1 10*3/uL (ref 0.0–0.4)
Eos: 1 %
Hematocrit: 45.1 % (ref 37.5–51.0)
Hemoglobin: 15.1 g/dL (ref 13.0–17.7)
Immature Grans (Abs): 0 10*3/uL (ref 0.0–0.1)
Immature Granulocytes: 0 %
Lymphocytes Absolute: 2.1 10*3/uL (ref 0.7–3.1)
Lymphs: 32 %
MCH: 29.3 pg (ref 26.6–33.0)
MCHC: 33.5 g/dL (ref 31.5–35.7)
MCV: 87 fL (ref 79–97)
Monocytes Absolute: 0.6 10*3/uL (ref 0.1–0.9)
Monocytes: 10 %
Neutrophils Absolute: 3.6 10*3/uL (ref 1.4–7.0)
Neutrophils: 56 %
Platelets: 291 10*3/uL (ref 150–450)
RBC: 5.16 x10E6/uL (ref 4.14–5.80)
RDW: 12.7 % (ref 11.6–15.4)
WBC: 6.4 10*3/uL (ref 3.4–10.8)

## 2019-03-15 LAB — SEDIMENTATION RATE: Sed Rate: 32 mm/hr — ABNORMAL HIGH (ref 0–15)

## 2019-03-15 LAB — TSH: TSH: 1.13 u[IU]/mL (ref 0.450–4.500)

## 2019-07-24 ENCOUNTER — Encounter: Payer: Self-pay | Admitting: Adult Health

## 2019-07-24 ENCOUNTER — Ambulatory Visit (INDEPENDENT_AMBULATORY_CARE_PROVIDER_SITE_OTHER): Payer: Medicaid Other | Admitting: Adult Health

## 2019-07-24 ENCOUNTER — Other Ambulatory Visit: Payer: Self-pay

## 2019-07-24 VITALS — BP 120/78 | HR 88 | Temp 97.3°F | Resp 20 | Wt 181.0 lb

## 2019-07-24 DIAGNOSIS — G43D0 Abdominal migraine, not intractable: Secondary | ICD-10-CM

## 2019-07-24 DIAGNOSIS — K219 Gastro-esophageal reflux disease without esophagitis: Secondary | ICD-10-CM | POA: Diagnosis not present

## 2019-07-24 DIAGNOSIS — R519 Headache, unspecified: Secondary | ICD-10-CM

## 2019-07-24 DIAGNOSIS — R1010 Upper abdominal pain, unspecified: Secondary | ICD-10-CM

## 2019-07-24 DIAGNOSIS — F840 Autistic disorder: Secondary | ICD-10-CM

## 2019-07-24 DIAGNOSIS — Z789 Other specified health status: Secondary | ICD-10-CM

## 2019-07-24 DIAGNOSIS — G8929 Other chronic pain: Secondary | ICD-10-CM

## 2019-07-24 MED ORDER — OMEPRAZOLE 20 MG PO CPDR: 20.0000 mg | DELAYED_RELEASE_CAPSULE | Freq: Every day | ORAL | 0 refills | Status: DC

## 2019-07-24 NOTE — Progress Notes (Signed)
Patient: Mark Webb Male    DOB: 15-Jul-1990   29 y.o.   MRN: [id number] Visit Date: 07/24/2019  Today's Provider: Jairo Ben, FNP   Chief Complaint  Patient presents with  . Abdominal Pain   Subjective:     Abdominal Pain This is a recurrent problem. The current episode started 1 to 4 weeks ago. The onset quality is sudden. The problem occurs intermittently. Duration: minutes. The problem has been gradually worsening. The pain is located in the epigastric region. The pain is moderate. The quality of the pain is aching. The abdominal pain radiates to the LUQ. Associated symptoms include flatus and headaches (Mom reports longstanding history of migraines, patient complains frequently of headaches, mother request referral to neurology.). Pertinent negatives include no belching, constipation, diarrhea, dysuria, fever, frequency, hematuria, nausea or vomiting. The pain is aggravated by certain positions. The pain is relieved by movement. He has tried nothing for the symptoms.  Patient with autism, it is a little difficult to obtain a thorough history from patient, however mom and patient answer questions.  Has had some acid taste during the day, symptoms are worse with laying down. Denies any hematochezia, blood.Regular bowel movement everyday per patient. Denies any nausea, vomiting or diarrhea.  He does have a reported mild headache.  Patient is a poor historian, mother is able to inform me of some information, however she reports patient does not speak that much about it to her. Denies any hemoptysis. Denies any chest pain. Denies pain with breathing. Denies any injury  Does do heavy lifting.  Denies any urinary symptoms. Mother and patient   denies any fever, body aches,chills, rash, chest pain, shortness of breath, nausea, vomiting, or diarrhea.   Mom Mark Webb is present for evaluation.  Mom would like to be called with the results, as patient has autism.Marland Kitchen    No  Known Allergies   Current Outpatient Medications:  .  Omega-3 Fatty Acids (FISH OIL) 1000 MG CAPS, Take by mouth., Disp: , Rfl:   Review of Systems  Constitutional: Negative.  Negative for activity change, appetite change, chills, diaphoresis, fatigue, fever and unexpected weight change.  HENT: Negative.   Eyes: Negative for photophobia and visual disturbance.  Respiratory: Negative.  Negative for apnea, cough, choking, chest tightness, shortness of breath, wheezing and stridor.   Cardiovascular: Negative.  Negative for chest pain, palpitations and leg swelling.  Gastrointestinal: Positive for abdominal pain and flatus. Negative for abdominal distention, anal bleeding, blood in stool, constipation, diarrhea, nausea, rectal pain and vomiting.  Genitourinary: Negative.  Negative for dysuria, frequency and hematuria.  Musculoskeletal: Negative.   Skin: Negative.   Neurological: Positive for headaches (Mom reports longstanding history of migraines, patient complains frequently of headaches, mother request referral to neurology.). Negative for dizziness, tremors, seizures, syncope, facial asymmetry, speech difficulty, weakness, light-headedness and numbness.       Patient has a mild generalized headache today.  Relieved by Tylenol.    Social History   Tobacco Use  . Smoking status: Never Smoker  . Smokeless tobacco: Never Used  Substance Use Topics  . Alcohol use: Yes    Alcohol/week: 0.0 standard drinks      Objective:   BP 120/78 (BP Location: Left Arm, Patient Position: Sitting, Cuff Size: Large)   Pulse 88   Temp (!) 97.3 F (36.3 C) (Temporal)   Resp 20   Wt 181 lb (82.1 kg)   BMI 27.52 kg/m  Vitals:  07/24/19 1402  BP: 120/78  Pulse: 88  Resp: 20  Temp: (!) 97.3 F (36.3 C)  TempSrc: Temporal  Weight: 181 lb (82.1 kg)  Body mass index is 27.52 kg/m.   Physical Exam Vitals reviewed.  Constitutional:      General: He is not in acute distress.    Appearance:  Normal appearance. He is well-developed and well-groomed. He is not ill-appearing, toxic-appearing or diaphoretic.  HENT:     Head: Normocephalic and atraumatic.     Nose: Nose normal.     Mouth/Throat:     Mouth: Mucous membranes are moist.  Eyes:     General: No scleral icterus.       Right eye: No discharge.        Left eye: No discharge.     Extraocular Movements: Extraocular movements intact.     Conjunctiva/sclera: Conjunctivae normal.     Pupils: Pupils are equal, round, and reactive to light.  Cardiovascular:     Rate and Rhythm: Normal rate and regular rhythm.     Pulses: Normal pulses.     Heart sounds: Normal heart sounds. No murmur. No friction rub. No gallop.   Pulmonary:     Effort: Pulmonary effort is normal. No respiratory distress.     Breath sounds: Normal breath sounds. No stridor. No wheezing, rhonchi or rales.  Chest:     Chest wall: No tenderness.  Abdominal:     General: Bowel sounds are normal. There is no distension or abdominal bruit. There are no signs of injury.     Palpations: Abdomen is soft. There is no mass.     Tenderness: There is abdominal tenderness (seems to be mild in nature. No facial grimace. ) in the right upper quadrant, epigastric area and left upper quadrant. There is no right CVA tenderness, left CVA tenderness, guarding or rebound. Negative signs include Murphy's sign, Rovsing's sign, McBurney's sign, psoas sign and obturator sign.     Hernia: No hernia is present.       Comments: patient reports pain in upper left quadrant, epigastrium, and right upper quadrant with palpation. Negative Jump test.   Skin:    General: Skin is warm and dry.     Comments: No rash on abdomen or back. No zoster.  Neurological:     Mental Status: He is alert.     GCS: GCS eye subscore is 4. GCS verbal subscore is 5. GCS motor subscore is 6.     Cranial Nerves: Cranial nerves are intact.     Sensory: Sensation is intact. No sensory deficit.     Motor: Motor  function is intact.     Coordination: Coordination is intact.     Gait: Gait is intact.     Comments: Mom reports he is at baseline, no change in behaviors.   Psychiatric:        Behavior: Behavior is cooperative.     Comments: Autistic, alert and attentive, poor historian, appears to be in no acute distress.   Patient moves on and off of exam table and in room without difficulty. Gait is normal in hall and in room.Patient answers questions appropriately and engages in conversation when questioned only..Patient makes eye contact.  At baseline per mother.  Autistic disorder.       No results found for any visits on 07/24/19.     Assessment & Plan    1. Pain of upper abdomen Orders Placed This Encounter  Procedures  . US Abdomen  Complete  . Lipase  . Amylase  . CBC with Differential/Platelet  . Comprehensive Metabolic Panel (CMET)  . Ambulatory referral to Neurology     2. Gastroesophageal reflux disease, unspecified whether esophagitis present Meds ordered this encounter  Medications  . omeprazole (PRILOSEC) 20 MG capsule    Sig: Take 1 capsule (20 mg total) by mouth daily.    Dispense:  30 capsule    Refill:  0  suspect GERD will start medication as above. He does eat a lot of hot sauce, advised to avoid, diet recommendations given, he also has history of NSAID's use. Advised Tylenol for pain.   3. Abdominal migraine, not intractable  Documented history of. Mom requests referral to neurology.   4. Chronic intractable headache, unspecified headache type Documented history of migraines, patient autistic and poor historian, mom requests neurology evaluation.   5. Autism Disorder : difficult to obtain thorough history or exam.     Discussed RED FLAGS of abdominal pain and for headaches. Advised mom emergency room if any symptoms worsen or new symptoms occur that need emergent evaluation immediately.  Mother verbalized understanding of all instructions given and denies  any further questions at this time.  Return in about 2 weeks (around 08/07/2019), or if symptoms worsen or fail to improve, for at any time for any worsening symptoms, Go to Emergency room/ urgent care if worse. Keep regular follow ups with Chrismon, Vickki Muff, PA  Advised patient/mother  call the office or your primary care doctor for an appointment if no improvement within 72 hours or if any symptoms change or worsen at any time  Advised ER or urgent Care if after hours or on weekend. Call 911 for emergency symptoms at any time.Patinet/ Mother - caregiver  verbalized understanding of all instructions given/reviewed and treatment plan and has no further questions or concerns at this time.     The entirety of the information documented in the History of Present Illness, Review of Systems and Physical Exam were personally obtained by me. Portions of this information were initially documented by the  Certified Medical Assistant whose name is documented in [place] and reviewed by me for thoroughness and accuracy.  I have personally performed the exam and reviewed the chart and it is accurate to the best of my knowledge.  Haematologist has been used and any errors in dictation or transcription are unintentional.  Kelby Aline. Golf, [facility]

## 2019-07-24 NOTE — Patient Instructions (Signed)

## 2019-07-25 ENCOUNTER — Telehealth: Payer: Self-pay | Admitting: Family Medicine

## 2019-07-25 ENCOUNTER — Ambulatory Visit: Payer: Medicaid Other

## 2019-07-25 LAB — CBC WITH DIFFERENTIAL/PLATELET
Basophils Absolute: 0 10*3/uL (ref 0.0–0.2)
Basos: 0 %
EOS (ABSOLUTE): 0 10*3/uL (ref 0.0–0.4)
Eos: 0 %
Hematocrit: 45.7 % (ref 37.5–51.0)
Hemoglobin: 15.6 g/dL (ref 13.0–17.7)
Immature Grans (Abs): 0 10*3/uL (ref 0.0–0.1)
Immature Granulocytes: 0 %
Lymphocytes Absolute: 1.7 10*3/uL (ref 0.7–3.1)
Lymphs: 22 %
MCH: 29.9 pg (ref 26.6–33.0)
MCHC: 34.1 g/dL (ref 31.5–35.7)
MCV: 88 fL (ref 79–97)
Monocytes Absolute: 0.6 10*3/uL (ref 0.1–0.9)
Monocytes: 8 %
Neutrophils Absolute: 5.5 10*3/uL (ref 1.4–7.0)
Neutrophils: 70 %
Platelets: 352 10*3/uL (ref 150–450)
RBC: 5.21 x10E6/uL (ref 4.14–5.80)
RDW: 12.6 % (ref 11.6–15.4)
WBC: 7.8 10*3/uL (ref 3.4–10.8)

## 2019-07-25 LAB — COMPREHENSIVE METABOLIC PANEL
ALT: 16 IU/L (ref 0–44)
AST: 16 IU/L (ref 0–40)
Albumin/Globulin Ratio: 1.6 (ref 1.2–2.2)
Albumin: 4.7 g/dL (ref 4.1–5.2)
Alkaline Phosphatase: 71 IU/L (ref 39–117)
BUN/Creatinine Ratio: 10 (ref 9–20)
BUN: 10 mg/dL (ref 6–20)
Bilirubin Total: 0.4 mg/dL (ref 0.0–1.2)
CO2: 23 mmol/L (ref 20–29)
Calcium: 10.1 mg/dL (ref 8.7–10.2)
Chloride: 103 mmol/L (ref 96–106)
Creatinine, Ser: 1.04 mg/dL (ref 0.76–1.27)
GFR calc Af Amer: 112 mL/min/{1.73_m2} (ref >59)
GFR calc non Af Amer: 97 mL/min/{1.73_m2} (ref >59)
Globulin, Total: 3 g/dL (ref 1.5–4.5)
Glucose: 100 mg/dL — ABNORMAL HIGH (ref 65–99)
Potassium: 3.8 mmol/L (ref 3.5–5.2)
Sodium: 141 mmol/L (ref 134–144)
Total Protein: 7.7 g/dL (ref 6.0–8.5)

## 2019-07-25 LAB — AMYLASE: Amylase: 63 U/L (ref 31–110)

## 2019-07-25 LAB — LIPASE: Lipase: 43 U/L (ref 13–78)

## 2019-07-25 NOTE — Telephone Encounter (Signed)
Please call patient's mother and follow up - she agreed to go for abdominal ultrasound yesterday. He has a lab note as well FYI so there will not be duplicate calls.  Thanks, Marvell Fuller MSN, AGNP-C, FNP-C

## 2019-07-25 NOTE — Progress Notes (Signed)
CBC, CMP, amylase and lipase are normal. I still suspect GERD, continue medication as prescribed, and follow up as directed . We will proceed with abdominal ultrasound given unclear exam and patient unable to give clear history due to Autism.  Advise patient call the office or your primary care doctor for an appointment if no improvement within 72 hours or if any symptoms change or worsen at any time  Advised ER or urgent Care if after hours or on weekend. Call 911 for emergency symptoms at any time.Patinet verbalized understanding of all instructions given/reviewed and treatment plan and has no further questions or concerns at this time.

## 2019-07-25 NOTE — Telephone Encounter (Signed)
Patients mother was advised she states that she did not know about appt, gave her phone number to imaging and advised her of patients labs results. KW

## 2019-07-25 NOTE — Telephone Encounter (Signed)
Imaging center called stating that the pt did not show up for appt this morning. Please advise.

## 2019-07-26 ENCOUNTER — Other Ambulatory Visit: Payer: Self-pay

## 2019-07-26 ENCOUNTER — Telehealth: Payer: Self-pay | Admitting: Family Medicine

## 2019-07-26 ENCOUNTER — Ambulatory Visit
Admission: RE | Admit: 2019-07-26 | Discharge: 2019-07-26 | Disposition: A | Discharge status: Home / Self Care | Payer: Medicaid Other | Source: Ambulatory Visit | Attending: Adult Health | Admitting: Adult Health

## 2019-07-26 DIAGNOSIS — G43D0 Abdominal migraine, not intractable: Secondary | ICD-10-CM | POA: Insufficient documentation

## 2019-07-26 DIAGNOSIS — R1010 Upper abdominal pain, unspecified: Secondary | ICD-10-CM | POA: Diagnosis present

## 2019-07-26 DIAGNOSIS — K219 Gastro-esophageal reflux disease without esophagitis: Secondary | ICD-10-CM | POA: Diagnosis present

## 2019-07-26 NOTE — Telephone Encounter (Signed)
Thanks I told her they would be calling her when she left the office, she must have not been contacted maybe.

## 2019-07-26 NOTE — Progress Notes (Signed)
Ultrasound is within normal limits. Does show some hepatic steatosis, likely fatty liver from elevated cholesterol.  Increased gas over pancreas.  Increase fiber in diet, Metamucil fiber supplement  mixed in apple juice or liquid of choice would be good to add per package instructions to help with regular bowel movements and relief of gas.  Fatty Liver recommendations are  to increased exercise, and a diet, alcohol discontinuation if any is consumed, weight loss. Vitamin E 800 units orally once daily by mouth has also been found to be beneficial for fatty liver not caused by alcohol I do suspect GERD and would like him to continue the medication as prescribed and keep follow up as recommended and return if symptoms persist.  Call if any symptoms worsening or changing and ER for acute/ emergent symptoms.  Keep regular follow up's with your PCP- Chrismon, Jodell Cipro, PA

## 2019-07-26 NOTE — Telephone Encounter (Signed)
Copied from CRM [id number]. Topic: General - Other >> Jul 26, 2019  4:20 PM Tamela Oddi wrote: Reason for CRM: Patient's mother called to check the status of an x-ray.  Stated that she should know by today.  Please advise and call to give results at [phone]

## 2019-07-30 NOTE — Telephone Encounter (Signed)
Spoke with pt's mother; she states that the pt is autistic; she also states that she had previously received the ultrasound result; pt's mother given information,  " Mark Webb did not order an lipid panel - for the acute visit. He can address that with PCP, or ok to order if mom wants to bring him in fasting but does not have to be before recheck with me, also mother called in regards to a xray? There was no xray ordered on patient"; she verbalized understanding; will route to office for notification.

## 2019-08-07 ENCOUNTER — Telehealth: Payer: Self-pay

## 2019-08-07 NOTE — Telephone Encounter (Signed)
Do you know about this?  Are you ok with me ordering them   Copied from CRM [id number]. Topic: Appointment Scheduling - Scheduling Inquiry for Clinic >> Aug 07, 2019 10:52 AM Randol Kern wrote: Reason for CRM: Pt is requesting lab orders for cholesterol. Please call mother back when orders are placed   Best contact: [phone] Doran Heater)

## 2019-08-07 NOTE — Telephone Encounter (Signed)
Patient has a follow up appointment with Marvell Fuller, FNP on 08-14-19. Could get this done at that time along with any follow up labs she has planned.

## 2019-08-07 NOTE — Telephone Encounter (Signed)
Mom is in agreement with getting the labs at that appointment.  He will eat an early breakfast and skip lunch so he will be fasting for several hours.

## 2019-08-14 ENCOUNTER — Encounter: Payer: Self-pay | Admitting: Adult Health

## 2019-08-14 ENCOUNTER — Ambulatory Visit (INDEPENDENT_AMBULATORY_CARE_PROVIDER_SITE_OTHER): Payer: Medicaid Other | Admitting: Adult Health

## 2019-08-14 ENCOUNTER — Other Ambulatory Visit: Payer: Self-pay

## 2019-08-14 VITALS — BP 110/76 | HR 83 | Temp 97.5°F | Resp 16 | Wt 182.0 lb

## 2019-08-14 DIAGNOSIS — K219 Gastro-esophageal reflux disease without esophagitis: Secondary | ICD-10-CM | POA: Diagnosis not present

## 2019-08-14 DIAGNOSIS — Z6827 Body mass index (BMI) 27.0-27.9, adult: Secondary | ICD-10-CM | POA: Diagnosis not present

## 2019-08-14 DIAGNOSIS — K760 Fatty (change of) liver, not elsewhere classified: Secondary | ICD-10-CM | POA: Diagnosis not present

## 2019-08-14 DIAGNOSIS — Z13220 Encounter for screening for lipoid disorders: Secondary | ICD-10-CM | POA: Diagnosis not present

## 2019-08-14 MED ORDER — OMEPRAZOLE 20 MG PO CPDR: 20.0000 mg | DELAYED_RELEASE_CAPSULE | Freq: Every day | ORAL | 0 refills | Status: DC

## 2019-08-14 NOTE — Progress Notes (Signed)
Patient: Mark Webb Male    DOB: Jun 20, 1990   29 y.o.   MRN: [id number] Visit Date: 08/14/2019  Today's Provider: Marcille Buffy, FNP   Chief Complaint  Patient presents with  . Gastroesophageal Reflux   Subjective:     HPI  GERD, Follow up:  The patient was last seen for GERD 3 weeks ago. Changes made since that visit include starting patient on Omeprazole 56m .  He reports excellent compliance with treatment. He is not having side effects. .Marland Kitchen He IS experiencing belching. He is NOT experiencing abdominal bloating, bilious reflux, chest pain, choking on food, cough, deep pressure at base of neck, difficulty swallowing, dysphagia, fullness after meals, heartburn, hoarseness, laryngitis, midespigastric pain, nausea, need to clear throat frequently or nocturnal burning   Ultrasound of abdomen results reviewed as below.   Seeing Dr. AJaynee Eaglesend of [place] for migraines.   All symptoms have resolved.  IMPRESSION: 1. Increase in liver echogenicity, a finding indicative of a degree of hepatic steatosis. No focal liver lesions evident.  2. Portions of pancreas obscured by gas. Visualized portions of pancreas appear normal.  3.  Study otherwise unremarkable.   Electronically Signed   By: WLowella GripIII M.D.   On: 07/26/2019 10:00  Patient  denies any fever, body aches,chills, rash, chest pain, shortness of breath, nausea, vomiting, or diarrhea.   ------------------------------------------------------------------------   No Known Allergies   Current Outpatient Medications:  .  Omega-3 Fatty Acids (FISH OIL) 1000 MG CAPS, Take by mouth., Disp: , Rfl:  .  omeprazole (PRILOSEC) 20 MG capsule, Take 1 capsule (20 mg total) by mouth daily., Disp: 30 capsule, Rfl: 0  Review of Systems  Constitutional: Negative.   HENT: Negative.   Respiratory: Negative.   Cardiovascular: Negative.   Gastrointestinal: Negative.   Genitourinary: Negative.     Musculoskeletal: Negative.   Skin: Negative.   Neurological: Negative for dizziness, tremors, seizures, syncope, facial asymmetry, speech difficulty, weakness, light-headedness, numbness and headaches.  Hematological: Negative.   Psychiatric/Behavioral: Negative.     Social History   Tobacco Use  . Smoking status: Never Smoker  . Smokeless tobacco: Never Used  Substance Use Topics  . Alcohol use: Yes    Alcohol/week: 0.0 standard drinks      Objective:   BP 110/76   Pulse 83   Temp (!) 97.5 F (36.4 C) (Oral)   Resp 16   Wt 182 lb (82.6 kg)   SpO2 99%   BMI 27.67 kg/m  Vitals:   08/14/19 1410  BP: 110/76  Pulse: 83  Resp: 16  Temp: (!) 97.5 F (36.4 C)  TempSrc: Oral  SpO2: 99%  Weight: 182 lb (82.6 kg)  Body mass index is 27.67 kg/m.   Physical Exam Vitals reviewed.  Constitutional:      General: He is not in acute distress.    Appearance: Normal appearance. He is well-developed and well-groomed. He is not ill-appearing, toxic-appearing or diaphoretic.  HENT:     Head: Normocephalic and atraumatic.     Right Ear: External ear normal.     Left Ear: External ear normal.     Nose: Nose normal.     Mouth/Throat:     Mouth: Mucous membranes are moist.  Eyes:     General: No scleral icterus.       Right eye: No discharge.        Left eye: No discharge.     Pupils: Pupils  are equal, round, and reactive to light.  Neck:     Vascular: No carotid bruit.  Cardiovascular:     Rate and Rhythm: Normal rate and regular rhythm.     Pulses: Normal pulses.     Heart sounds: Normal heart sounds. No murmur. No friction rub. No gallop.   Pulmonary:     Effort: Pulmonary effort is normal. No respiratory distress.     Breath sounds: Normal breath sounds. No stridor. No wheezing, rhonchi or rales.  Chest:     Chest wall: No tenderness.  Abdominal:     General: Bowel sounds are normal. There is no distension or abdominal bruit. There are no signs of injury.      Palpations: Abdomen is soft. There is no mass.     Tenderness: There is no abdominal tenderness. There is no right CVA tenderness, left CVA tenderness, guarding or rebound. Negative signs include Murphy's sign, Rovsing's sign, McBurney's sign, psoas sign and obturator sign.     Hernia: No hernia is present.       Comments: Normal abdominal exam at today's visit.   Musculoskeletal:        General: Normal range of motion.     Cervical back: Normal range of motion and neck supple. No rigidity or tenderness.  Lymphadenopathy:     Cervical: No cervical adenopathy.  Skin:    General: Skin is warm and dry.     Comments: No rash on abdomen or back. No zoster.  Neurological:     Mental Status: He is alert and oriented to person, place, and time.     GCS: GCS eye subscore is 4. GCS verbal subscore is 5. GCS motor subscore is 6.     Cranial Nerves: Cranial nerves are intact.     Sensory: Sensation is intact. No sensory deficit.     Motor: Motor function is intact.     Coordination: Coordination is intact.     Gait: Gait is intact.     Comments: Mom reports he is at baseline, no change in behaviors.   Psychiatric:        Behavior: Behavior is cooperative.     Comments: Autistic, alert and attentive, poor historian, appears to be in no acute distress.   Patient moves on and off of exam table and in room without difficulty. Gait is normal in hall and in room.Patient answers questions appropriately and engages in conversation when questioned only..Patient makes eye contact.  At baseline per mother.  Autistic disorder.       No results found for any visits on 08/14/19.     Assessment & Plan      Meds ordered this encounter  Medications  . omeprazole (PRILOSEC) 20 MG capsule    Sig: Take 1 capsule (20 mg total) by mouth daily.    Dispense:  90 capsule    Refill:  0   Gastroesophageal reflux disease, unspecified whether esophagitis present - Plan: Lipid Panel w/o Chol/HDL Ratio, Sed Rate  (ESR), Hepatic function panel  Fatty liver - Plan: Hepatic function panel  Screening, lipid - Plan: Lipid Panel w/o Chol/HDL Ratio Will continue Prilosec as above- may try to wean off after one more month.  cholesterol today as he is fasting. Repeat ESR as was elevated in September.   Advised patient call the office or your primary care doctor for an appointment if no improvement within 72 hours or if any symptoms change or worsen at any time  Advised ER or  urgent Care if after hours or on weekend. Call 911 for emergency symptoms at any time.Patinet verbalized understanding of all instructions given/reviewed and treatment plan and has no further questions or concerns at this time.       The entirety of the information documented in the History of Present Illness, Review of Systems and Physical Exam were personally obtained by me. Portions of this information were initially documented by the  Certified Medical Assistant whose name is documented in [place] and reviewed by me for thoroughness and accuracy.  I have personally performed the exam and reviewed the chart and it is accurate to the best of my knowledge.  Haematologist has been used and any errors in dictation or transcription are unintentional.  Kelby Aline. Bishop, [facility]

## 2019-08-14 NOTE — Progress Notes (Deleted)
       Patient: Mark Webb Male    DOB: [birth year]   28 y.o.   MRN: [id number] Visit Date: 08/14/2019  Today's Provider: Jairo Ben, FNP   Chief Complaint  Patient presents with  . Gastroesophageal Reflux   Subjective:     HPI  GERD, Follow up:  The patient was last seen for GERD 1 months ago. Changes made since that visit include patient was started on Omeprazole 20mg  .  He reports excellent compliance with treatment. He is not having side effects.  He IS experiencing abdominal bloating, chest pain, choking on food, cough, difficulty swallowing, fullness after meals, heartburn, hoarseness, midespigastric pain, nausea, need to clear throat frequently, nocturnal burning, regurgitation of undigested food and shortness of breath. He is NOT experiencing {Sx; ge reflux:19417:o}  ------------------------------------------------------------------------   No Known Allergies   Current Outpatient Medications:  .  Omega-3 Fatty Acids (FISH OIL) 1000 MG CAPS, Take by mouth., Disp: , Rfl:  .  omeprazole (PRILOSEC) 20 MG capsule, Take 1 capsule (20 mg total) by mouth daily., Disp: 30 capsule, Rfl: 0  Review of Systems  Social History   Tobacco Use  . Smoking status: Never Smoker  . Smokeless tobacco: Never Used  Substance Use Topics  . Alcohol use: Yes    Alcohol/week: 0.0 standard drinks      Objective:   There were no vitals taken for this visit. There were no vitals filed for this visit.There is no height or weight on file to calculate BMI.   Physical Exam   No results found for any visits on 08/14/19.     Assessment & Plan        10/14/19, FNP  [facility]

## 2019-08-14 NOTE — Patient Instructions (Addendum)
Omeprazole tablets (OTC) What is this medicine? OMEPRAZOLE (oh ME pray zol) prevents the production of acid in the stomach. It is used to treat the symptoms of heartburn. You can buy this medicine without a prescription. This product is not for long-term use, unless otherwise directed by your doctor or health care professional. This medicine may be used for other purposes; ask your health care provider or pharmacist if you have questions. COMMON BRAND NAME(S): Prilosec OTC What should I tell my health care provider before I take this medicine? They need to know if you have any of these conditions:  black or bloody stools  chest pain  difficulty swallowing  have had heartburn for over 3 months  have heartburn with dizziness, lightheadedness or sweating  liver disease  lupus  stomach pain  unexplained weight loss  vomiting with blood  wheezing  an unusual or allergic reaction to omeprazole, other medicines, foods, dyes, or preservatives  pregnant or trying to get pregnant  breast-feeding How should I use this medicine? Take this medicine by mouth with a glass of water. Follow the directions on the product label. Do not cut, crush or chew this medicine. Swallow the tablets whole. Take this medicine on an empty stomach, at least 30 minutes before breakfast. Take your medicine at regular intervals. Do not take it more often than directed. Talk to your pediatrician regarding the use of this medicine in children. Special care may be needed. Overdosage: If you think you have taken too much of this medicine contact a poison control center or emergency room at once. NOTE: This medicine is only for you. Do not share this medicine with others. What if I miss a dose? If you miss a dose, take it as soon as you can. If it is almost time for your next dose, take only that dose. Do not take double or extra doses. What may interact with this medicine? Do not take this medicine with any of  the following medications:  atazanavir  clopidogrel  nelfinavir  rilpivirine This medicine may also interact with the following medications:  antifungals like itraconazole, ketoconazole, and voriconazole  certain antivirals for HIV or hepatitis  certain medicines that treat or prevent blood clots like warfarin  cilostazol  citalopram  cyclosporine  dasatinib  digoxin  disulfiram  diuretics  erlotinib  iron supplements  medicines for anxiety, panic, and sleep like diazepam  medicines for seizures like carbamazepine, phenobarbital, phenytoin  methotrexate  mycophenolate mofetil  nilotinib  rifampin  St. John's wort  tacrolimus  vitamin B12 This list may not describe all possible interactions. Give your health care provider a list of all the medicines, herbs, non-prescription drugs, or dietary supplements you use. Also tell them if you smoke, drink alcohol, or use illegal drugs. Some items may interact with your medicine. What should I watch for while using this medicine? It can take several days before your heartburn gets better. Tell your healthcare professional if your symptoms do not start to get better or if they get worse. If you need to take this medicine for more than 14 days, talk to your healthcare professional. Heartburn may sometimes be caused by a more serious condition. This medicine may cause a decrease in vitamin B12. You should make sure that you get enough vitamin B12 while you are taking this medicine. Discuss the foods you eat and the vitamins you take with your health care professional. What side effects may I notice from receiving this medicine? Side effects that  you should report to your doctor or health care professional as soon as possible:  allergic reactions like skin rash, itching or hives, swelling of the face, lips, or tongue  bone pain  breathing problems  fever or sore throat  joint pain  rash on cheeks or arms that  gets worse in the sun  redness, blistering, peeling, or loosening of the skin, including inside the mouth  severe diarrhea  signs and symptoms of kidney injury like trouble passing urine or change in the amount of urine  signs and symptoms of low magnesium like muscle cramps; muscle pain; muscle weakness; tremors; seizures; or fast, irregular heartbeat  stomach polyps  unusual bleeding or bruising Side effects that usually do not require medical attention (report to your doctor or health care professional if they continue or are bothersome):  diarrhea  dry mouth  gas  headache  nausea  stomach pain This list may not describe all possible side effects. Call your doctor for medical advice about side effects. You may report side effects to FDA at 1-800-FDA-1088. Where should I keep my medicine? Keep out of the reach of children. Store at room temperature between 20 and 25 degrees C (68 and 77 degrees F). Protect from light and moisture. Throw away any unused medicine after the expiration date. NOTE: This sheet is a summary. It may not cover all possible information. If you have questions about this medicine, talk to your doctor, pharmacist, or health care provider.  2020 Elsevier/Gold Standard (2018-03-22 13:06:30) Food Choices for Gastroesophageal Reflux Disease, Adult When you have gastroesophageal reflux disease (GERD), the foods you eat and your eating habits are very important. Choosing the right foods can help ease your discomfort. Think about working with a nutrition specialist (dietitian) to help you make good choices. What are tips for following this plan?  Meals  Choose healthy foods that are low in fat, such as fruits, vegetables, whole grains, low-fat dairy products, and lean meat, fish, and poultry.  Eat small meals often instead of 3 large meals a day. Eat your meals slowly, and in a place where you are relaxed. Avoid bending over or lying down until 2-3 hours  after eating.  Avoid eating meals 2-3 hours before bed.  Avoid drinking a lot of liquid with meals.  Cook foods using methods other than frying. Bake, grill, or broil food instead.  Avoid or limit: ? Chocolate. ? Peppermint or spearmint. ? Alcohol. ? Pepper. ? Black and decaffeinated coffee. ? Black and decaffeinated tea. ? Bubbly (carbonated) soft drinks. ? Caffeinated energy drinks and soft drinks.  Limit high-fat foods such as: ? Fatty meat or fried foods. ? Whole milk, cream, butter, or ice cream. ? Nuts and nut butters. ? Pastries, donuts, and sweets made with butter or shortening.  Avoid foods that cause symptoms. These foods may be different for everyone. Common foods that cause symptoms include: ? Tomatoes. ? Oranges, lemons, and limes. ? Peppers. ? Spicy food. ? Onions and garlic. ? Vinegar. Lifestyle  Maintain a healthy weight. Ask your doctor what weight is healthy for you. If you need to lose weight, work with your doctor to do so safely.  Exercise for at least 30 minutes for 5 or more days each week, or as told by your doctor.  Wear loose-fitting clothes.  Do not smoke. If you need help quitting, ask your doctor.  Sleep with the head of your bed higher than your feet. Use a wedge under the mattress or  blocks under the bed frame to raise the head of the bed. Summary  When you have gastroesophageal reflux disease (GERD), food and lifestyle choices are very important in easing your symptoms.  Eat small meals often instead of 3 large meals a day. Eat your meals slowly, and in a place where you are relaxed.  Limit high-fat foods such as fatty meat or fried foods.  Avoid bending over or lying down until 2-3 hours after eating.  Avoid peppermint and spearmint, caffeine, alcohol, and chocolate. This information is not intended to replace advice given to you by your health care provider. Make sure you discuss any questions you have with your health care  provider. Document Revised: 09/21/2018 Document Reviewed: 07/06/2016 Elsevier Patient Education  2020 [employer].   Plan de alimentacin restringido en grasas y colesterol Fat and Cholesterol Restricted Eating Plan El exceso de grasas y colesterol en la dieta puede causar problemas de [place]. Elegir los alimentos adecuados ayuda a [employer] niveles de grasas y [place]. Esto puede evitarle contraer ciertas enfermedades. El mdico puede recomendarle un plan de alimentacin que incluya lo siguiente:  Grasas totales: ______% o menos del total de caloras por da.  Grasas saturadas: ______% o menos del total de caloras por da.  Colesterol: menos de _________mg Karie Chimera.  Fibra: ______g Karie Chimera. Cules son algunos consejos para seguir este plan? Planificacin de las comidas  En las comidas, [place] su plato en cuatro partes iguales: ? Llene la mitad del plato con verduras y ensaladas de hojas verdes. ? Llene un cuarto del plato con cereales integrales. ? Llene un cuarto del plato con alimentos con protenas con bajo contenido de grasas [employer]).  Coma pescado con alto contenido de grasas omega3 al [employer] veces por semana. Esas grasas se encuentran en la caballa, el atn, las sardinas y el salmn.  Coma alimentos con [address] contenido de [place], como cereales [place], frijoles, [place], [place], [place], guisantes y [place]. Consejos generales   Si necesita adelgazar, consulte a su mdico.  Evite lo siguiente: ? Alimentos con Engineer, mining. ? Comidas fritas. ? Alimentos con aceites parcialmente hidrogenados.  Limite el consumo de alcohol a no ms de por da si es mujer y no est [place], y por da si es hombre. Una medida equivale a 12oz de Financial controller, 5oz de vino o 1oz de bebidas alcohlicas de alta graduacin. Lea las etiquetas de los alimentos  Lea las etiquetas de los alimentos para conocer lo siguiente: ? Si contienen grasas  trans. ? Si contienen aceites parcialmente hidrogenados. ? La cantidad de grasas saturadas (g) que contiene cada porcin. ? La cantidad de colesterol (mg) que contiene cada porcin. ? La cantidad de fibra (g) que contiene cada porcin.  Elija alimentos con grasas saludables, tales como las siguientes: ? Grasas monoinsaturadas. ? Grasas poliinsaturadas. ? Grasas omega3.  Elija productos de cereal que tengan cereales integrales. Busque la palabra "integral" en Estate agent de la lista de ingredientes. Al cocinar  Emplee mtodos de coccin con poca cantidad de grasa. Por ejemplo, hornear, hervir, grillar y Transport planner.  Coma ms comidas caseras. Coma en restaurantes y bares con menos frecuencia.  Evite cocinar usando grasas saturadas, como [place], crema, aceite de [place], aceite de palmiste y aceite de [place]. Crane recomendados  Frutas  Frutas frescas, en conserva (en su jugo natural) o frutas congeladas. Verduras  Verduras frescas o congeladas (crudas, al vapor, asadas o grilladas). Ensaladas de hojas verdes. Granos  Cereales integrales, como  panes, galletas, cereales y pastas de integrales o de trigo integral. Avena sin endulzar, trigo bulgur, cebada, quinua o arroz integral. Tortillas de harina de maz o trigo integral. Carnes y otros alimentos ricos en protenas  Carne de res molida (al 85% o ms magra), carne de res de animales alimentados con pastos o carne de res sin la grasa. Pollo o pavo sin piel. Carne de pollo o de [place]. Cerdo sin la grasa. Todos los pescados y frutos de mar. Claras de huevo. Porotos, guisantes o lentejas secos. Frutos secos o semillas sin sal. Frijoles enlatados sin sal. Mantequillas de frutos secos sin azcar ni aceite agregados. Lcteos  Productos lcteos descremados o semidescremados, como [employer] o al 1%, quesos reducidos en grasas o al 2%, queso cottage o ricota con bajo contenido de [place] o sin contenido de [place], o yogur natural  descremado o semidescremado. Grasas y aceites  Margarina untable que no contenga grasas trans. [place] y condimentos para ensaladas livianos o reducidos en grasas. [place]. Aceites de oliva, canola, ssamo o crtamo. Es posible que los productos que se enumeran ms [place] no constituyan una lista completa de los alimentos y las bebidas que puede tomar. Consulte a un nutricionista para obtener ms informacin. Alimentos que deben evitarse Frutas  Fruta enlatada en almbar espeso. Frutas con salsa de crema o mantequilla. Frutas cocidas en aceite. Verduras  Verduras cocinadas con salsas de queso, crema o mantequilla. Verduras fritas. Granos  Pan blanco. Pastas blancas. Arroz blanco. Pan de maz. Bagels, pasteles y croissants. Galletas saladas y colaciones que contengan grasas trans y aceites hidrogenados. Carnes y otros alimentos ricos en protenas  Cortes de carne con alto contenido de [place]. Costillas, alas de pollo, tocineta, salchicha, mortadela, salame, chinchulines, tocino, perros calientes, salchichas alemanas y embutidos envasados. Hgado y otros rganos. Huevos enteros y yemas de [place]. Pollo y pavo con piel. Carne frita. Lcteos  [place]he entera o al 2%, crema, mezcla de [place] y crema, y queso crema. Quesos enteros. Yogur entero o endulzado. Quesos con toda su grasa. [place] no lcteas y coberturas batidas. Quesos procesados, quesos para untar y [place]. Bebidas  Alcohol. Bebidas endulzadas con azcar, como refrescos, limonada y bebidas frutales. Grasas y aceites  [place], [place] en barra, [place] de [place], [place], [place] clarificada o grasa de tocino. Aceites de coco, de palmiste y de palma. Dulces y postres  Jarabe de maz, azcares, miel y Control and instrumentation engineer. Caramelos. Mermeladas y [place]. Chrissie Noa. Cereales endulzados. Galletas, pasteles, bizcochuelos, donas, muffins y helado. Es posible que los productos que se enumeran ms [place] no constituyan una lista completa de los  alimentos y las bebidas que Nurse, adult. Consulte a un nutricionista para obtener ms informacin. Resumen  Elegir los alimentos adecuados ayuda a Theatre manager normales los niveles de grasas y [place]. Esto puede evitarle contraer ciertas enfermedades.  En las comidas, llene la mitad del plato con verduras y ensaladas de hojas verdes.  Coma alimentos con alto contenido de [place], como cereales [place], frijoles, [place], zanahorias, guisantes y [place].  Limite los alimentos con azcar agregada y grasas saturadas, el alcohol y las comidas fritas. Esta informacin no tiene Marine scientist el consejo del mdico. Asegrese de hacerle al mdico cualquier pregunta que tenga. Document Revised: 02/01/2018 Document Reviewed: 05/18/2017 Elsevier Patient Education  2020 [employer].

## 2019-08-15 LAB — HEPATIC FUNCTION PANEL
ALT: 20 IU/L (ref 0–44)
AST: 20 IU/L (ref 0–40)
Albumin: 4.7 g/dL (ref 4.1–5.2)
Alkaline Phosphatase: 74 IU/L (ref 39–117)
Bilirubin Total: 0.3 mg/dL (ref 0.0–1.2)
Bilirubin, Direct: 0.08 mg/dL (ref 0.00–0.40)
Total Protein: 7.3 g/dL (ref 6.0–8.5)

## 2019-08-15 LAB — SEDIMENTATION RATE: Sed Rate: 11 mm/hr (ref 0–15)

## 2019-08-15 LAB — LIPID PANEL W/O CHOL/HDL RATIO
Cholesterol, Total: 242 mg/dL — ABNORMAL HIGH (ref 100–199)
HDL: 39 mg/dL — ABNORMAL LOW (ref >39)
LDL Chol Calc (NIH): 162 mg/dL — ABNORMAL HIGH (ref 0–99)
Triglycerides: 223 mg/dL — ABNORMAL HIGH (ref 0–149)
VLDL Cholesterol Cal: 41 mg/dL — ABNORMAL HIGH (ref 5–40)

## 2019-08-15 NOTE — Progress Notes (Signed)
Total cholesterol, triglycerides and LDL elevated.  Discuss lifestyle modification with patient e.g. increase exercise/ activity to at least 30 to 45 minutes daily, fiber, fruits, vegetables, lean meat, and omega 3/fish intake and decrease saturated fat and processed/ packaged foods. If patient following strict diet and exercise program already please schedule follow up appointment with Maurine Minister to discuss possible statin.  Increased risk for heart disease and stroke with uncontrolled cholesterol.  Be sure he was truly fasting yesterday.   Liver panel is normal. Sed rate for inflammation returned to normal.  Recheck cholesterol in 4 months.

## 2019-08-20 ENCOUNTER — Ambulatory Visit: Payer: Self-pay | Admitting: Family Medicine

## 2019-08-20 NOTE — Progress Notes (Deleted)
       Patient: Mark Webb Male    DOB: 03-13-1991   28 y.o.   MRN: [id number] Visit Date: 08/20/2019  Today's Provider: Dortha Kern, PA   No chief complaint on file.  Subjective:     HPI   Patient was seen by Marvell Fuller 08/14/2019 and had labs done. Patient is here to discuss lab results.  No Known Allergies   Current Outpatient Medications:  .  Omega-3 Fatty Acids (FISH OIL) 1000 MG CAPS, Take by mouth., Disp: , Rfl:  .  omeprazole (PRILOSEC) 20 MG capsule, Take 1 capsule (20 mg total) by mouth daily., Disp: 90 capsule, Rfl: 0  Review of Systems  Constitutional: Negative for appetite change, chills and fever.  Respiratory: Negative for chest tightness, shortness of breath and wheezing.   Cardiovascular: Negative for chest pain and palpitations.  Gastrointestinal: Negative for abdominal pain, nausea and vomiting.    Social History   Tobacco Use  . Smoking status: Never Smoker  . Smokeless tobacco: Never Used  Substance Use Topics  . Alcohol use: Yes    Alcohol/week: 0.0 standard drinks      Objective:   There were no vitals taken for this visit. There were no vitals filed for this visit.There is no height or weight on file to calculate BMI.   Physical Exam   No results found for any visits on 08/20/19.     Assessment & Plan        Dortha Kern, PA  [facility] +

## 2019-08-24 ENCOUNTER — Other Ambulatory Visit: Payer: Self-pay

## 2019-08-24 ENCOUNTER — Ambulatory Visit (INDEPENDENT_AMBULATORY_CARE_PROVIDER_SITE_OTHER): Payer: Medicaid Other | Admitting: Family Medicine

## 2019-08-24 ENCOUNTER — Encounter: Payer: Self-pay | Admitting: Family Medicine

## 2019-08-24 VITALS — BP 125/84 | HR 102 | Temp 96.2°F | Resp 18 | Ht 68.0 in | Wt 180.8 lb

## 2019-08-24 DIAGNOSIS — E782 Mixed hyperlipidemia: Secondary | ICD-10-CM

## 2019-08-24 DIAGNOSIS — K219 Gastro-esophageal reflux disease without esophagitis: Secondary | ICD-10-CM

## 2019-08-24 DIAGNOSIS — Z713 Dietary counseling and surveillance: Secondary | ICD-10-CM

## 2019-08-24 DIAGNOSIS — F840 Autistic disorder: Secondary | ICD-10-CM

## 2019-08-24 NOTE — Progress Notes (Signed)
Patient: Mark Webb Male    DOB: 1990/08/23   29 y.o.   MRN: [id number] Visit Date: 08/24/2019  Today's Provider: Vernie Murders, PA   Discuss Lab results from 08/14/2019 office visit.  Subjective:     Patient states that He and his Mother wants to discuss lab results from 08/14/2019 in reference to cholesterol.  Past Medical History:  Diagnosis Date  . Autism    No past surgical history on file.   Patient Active Problem List   Diagnosis Date Noted  . Fatty liver 08/14/2019  . Screening, lipid 08/14/2019  . Pain of upper abdomen 07/24/2019  . Gastroesophageal reflux disease 07/24/2019  . Poor historian 07/24/2019  . Constipation 08/05/2016  . Active autistic disorder 01/21/2015  . Chronic intractable headache 01/21/2015  . Migraine 01/21/2015   Family History  Problem Relation Age of Onset  . Healthy Mother   . Hypertension Father   . Healthy Sister   . Healthy Brother   . Hypertension Maternal Grandmother   . Thyroid disease Maternal Grandmother   . Lymphoma Maternal Grandmother   . Arthritis Maternal Grandmother   . COPD Maternal Grandfather   . Hypertension Paternal Grandmother   . Diabetes Paternal Grandfather   . COPD Paternal Grandfather    No Known Allergies  Current Outpatient Medications:  .  Omega-3 Fatty Acids (FISH OIL) 1000 MG CAPS, Take by mouth., Disp: , Rfl:  .  omeprazole (PRILOSEC) 20 MG capsule, Take 1 capsule (20 mg total) by mouth daily., Disp: 90 capsule, Rfl: 0  Review of Systems  Social History   Tobacco Use  . Smoking status: Never Smoker  . Smokeless tobacco: Never Used  Substance Use Topics  . Alcohol use: Yes    Alcohol/week: 0.0 standard drinks      Objective:   BP 125/84 (BP Location: Right Arm, Patient Position: Sitting, Cuff Size: Normal)   Pulse (!) 102   Temp (!) 96.2 F (35.7 C) (Temporal)   Resp 18   Ht 5\' 8"  (1.727 m)   Wt 180 lb 12.8 oz (82 kg)   SpO2 97%   BMI 27.49 kg/m  Vitals:   08/24/19 1505  BP: 125/84  Pulse: (!) 102  Resp: 18  Temp: (!) 96.2 F (35.7 C)  TempSrc: Temporal  SpO2: 97%  Weight: 180 lb 12.8 oz (82 kg)  Height: 5\' 8"  (1.727 m)  Body mass index is 27.49 kg/m.  Physical Exam Constitutional:      General: He is not in acute distress.    Appearance: He is well-developed.  HENT:     Head: Normocephalic and atraumatic.     Right Ear: Hearing normal.     Left Ear: Hearing normal.     Nose: Nose normal.  Eyes:     General: Lids are normal. No scleral icterus.       Right eye: No discharge.        Left eye: No discharge.     Conjunctiva/sclera: Conjunctivae normal.  Cardiovascular:     Rate and Rhythm: Normal rate and regular rhythm.     Pulses: Normal pulses.     Heart sounds: Normal heart sounds.  Pulmonary:     Effort: Pulmonary effort is normal. No respiratory distress.     Breath sounds: Normal breath sounds.  Abdominal:     General: Bowel sounds are normal.     Palpations: Abdomen is soft.  Musculoskeletal:  General: Normal range of motion.  Skin:    Findings: No lesion or rash.  Neurological:     Mental Status: He is alert and oriented to person, place, and time.  Psychiatric:        Speech: Speech normal.     Comments: Apprehensive and difficult to engage due to autism.       Assessment & Plan    1. Mixed hyperlipidemia Recent labs showed drop of HDL to 39 with LDL up to 162 on 08-14-19. Worried about starting a statin at this time. Family wants to work on a supplement such as Tour manager with Co-Q 10 daily, regular exercise and a low fat diet, first. Recheck lipids and liver function in 3-4 months.   2. Gastroesophageal reflux disease, unspecified whether esophagitis present Family states the use of Omeprazole has helped with dyspepsia a great deal. No melena, hematemesis or hematochezia. Continue present dosage for a month. Recheck if no better to consider need for GI referral.  3. Active autistic  disorder Apprehensive and difficult to engage. Most of the conversation is with family member present with him.  4. Dietary counseling Went over labs of hyperlipidemia and GERD. Recommended he restrict late evening eating, overfilling stomach, caffeine, fats and greasy foods. Should increase fresh fruits and vegetable and exercise regularly. Recheck in 3-4 months to assess progress.     Dortha Kern, PA  [facility]

## 2019-09-04 NOTE — Progress Notes (Deleted)
[facility]    Provider:  Dr Jaynee Eagles Requesting Provider: Lance Muss NP Primary Care Provider:  Margo Common, PA  CC:  ***  HPI:  Mark Webb is a 29 y.o. male here as requested by Lance Muss NP for migraines.  Past medical history autistic disorder, headache, fatty liver. I reviewed Lance Muss NP's notes. He is autistic, documented hx of migraines, mother requested appointment.  Mother states he complains of chronic headaches, he has been having headaches on and off for several years, his headaches are lasting longer than usual and are coming more often, he gets them 2-3 times each week, there is no pattern to the time of day or the day a week that he gets the headaches.  Mother describes them as feeling like the top of his head is very hot, patient will put his head under the faucet with ice cold water and let it run over his head to cool it off, she thinks it may be some type of migraine headache, they have gone on for many years and they seem to be worsening, she would like to have some testing done for peace of mind that there is nothing anything more serious going on.  3 Advil and rest in a dark room will stop the headache in an hour and is having them 2 headaches a week now, no nausea vomiting or apparent sensitivity to light or sound but difficult to get an accurate history with his degree of autism.  CMP showed elevated glucose otherwise normal, normal CBC, normal sed rate, normal thyroid.  Reviewed notes, labs and imaging from outside physicians, which showed ***  Review of Systems: Patient complains of symptoms per HPI as well as the following symptoms ***. Pertinent negatives and positives per HPI. All others negative.   Social History   Socioeconomic History  . Marital status: Single    Spouse name: Not on file  . Number of children: Not on file  . Years of education: Not on file  . Highest education level: Not on file    Occupational History  . Not on file  Tobacco Use  . Smoking status: Never Smoker  . Smokeless tobacco: Never Used  Substance and Sexual Activity  . Alcohol use: Yes    Alcohol/week: 0.0 standard drinks  . Drug use: No  . Sexual activity: Not on file  Other Topics Concern  . Not on file  Social History Narrative  . Not on file   Social Determinants of Health   Financial Resource Strain:   . Difficulty of Paying Living Expenses:   Food Insecurity:   . Worried About Charity fundraiser in the Last Year:   . Arboriculturist in the Last Year:   Transportation Needs:   . Film/video editor (Medical):   Marland Kitchen Lack of Transportation (Non-Medical):   Physical Activity:   . Days of Exercise per Week:   . Minutes of Exercise per Session:   Stress:   . Feeling of Stress :   Social Connections:   . Frequency of Communication with Friends and Family:   . Frequency of Social Gatherings with Friends and Family:   . Attends Religious Services:   . Active Member of Clubs or Organizations:   . Attends Archivist Meetings:   Marland Kitchen Marital Status:   Intimate Partner Violence:   . Fear of Current or Ex-Partner:   . Emotionally Abused:   Marland Kitchen Physically Abused:   .  Sexually Abused:     Family History  Problem Relation Age of Onset  . Healthy Mother   . Hypertension Father   . Healthy Sister   . Healthy Brother   . Hypertension Maternal Grandmother   . Thyroid disease Maternal Grandmother   . Lymphoma Maternal Grandmother   . Arthritis Maternal Grandmother   . COPD Maternal Grandfather   . Hypertension Paternal Grandmother   . Diabetes Paternal Grandfather   . COPD Paternal Grandfather     Past Medical History:  Diagnosis Date  . Autism     Patient Active Problem List   Diagnosis Date Noted  . Fatty liver 08/14/2019  . Screening, lipid 08/14/2019  . Pain of upper abdomen 07/24/2019  . Gastroesophageal reflux disease 07/24/2019  . Poor historian 07/24/2019  .  Constipation 08/05/2016  . Active autistic disorder 01/21/2015  . Chronic intractable headache 01/21/2015  . Migraine 01/21/2015    No past surgical history on file.  Current Outpatient Medications  Medication Sig Dispense Refill  . Omega-3 Fatty Acids (FISH OIL) 1000 MG CAPS Take by mouth.    Marland Kitchen omeprazole (PRILOSEC) 20 MG capsule Take 1 capsule (20 mg total) by mouth daily. 90 capsule 0   No current facility-administered medications for this visit.    Allergies as of 09/05/2019  . (No Known Allergies)    Vitals: There were no vitals taken for this visit. Last Weight:  Wt Readings from Last 1 Encounters:  08/24/19 180 lb 12.8 oz (82 kg)   Last Height:   Ht Readings from Last 1 Encounters:  08/24/19 5\' 8"  (1.727 m)     Physical exam: Exam: Gen: NAD, conversant, well nourised, obese, well groomed                     CV: RRR, no MRG. No Carotid Bruits. No peripheral edema, warm, nontender Eyes: Conjunctivae clear without exudates or hemorrhage  Neuro: Detailed Neurologic Exam  Speech:    Speech is normal; fluent and spontaneous with normal comprehension.  Cognition:    The patient is oriented to person, place, and time;     recent and remote memory intact;     language fluent;     normal attention, concentration,     fund of knowledge Cranial Nerves:    The pupils are equal, round, and reactive to light. The fundi are normal and spontaneous venous pulsations are present. Visual fields are full to finger confrontation. Extraocular movements are intact. Trigeminal sensation is intact and the muscles of mastication are normal. The face is symmetric. The palate elevates in the midline. Hearing intact. Voice is normal. Shoulder shrug is normal. The tongue has normal motion without fasciculations.   Coordination:    Normal finger to nose and heel to shin. Normal rapid alternating movements.   Gait:    Heel-toe and tandem gait are normal.   Motor Observation:    No  asymmetry, no atrophy, and no involuntary movements noted. Tone:    Normal muscle tone.    Posture:    Posture is normal. normal erect    Strength:    Strength is V/V in the upper and lower limbs.      Sensation: intact to LT     Reflex Exam:  DTR's:    Deep tendon reflexes in the upper and lower extremities are normal bilaterally.   Toes:    The toes are downgoing bilaterally.   Clonus:    Clonus is absent.  Assessment/Plan:    No orders of the defined types were placed in this encounter.  No orders of the defined types were placed in this encounter.   Cc: Chrismon, Jodell Cipro, PA,  Chrismon, Jodell Cipro, [place]  Naomie Dean, MD  [facility] [address] [place], [place] [address]  Phone [phone] Fax [phone]

## 2019-09-05 ENCOUNTER — Ambulatory Visit: Payer: Medicaid Other | Admitting: Neurology

## 2020-01-17 NOTE — Progress Notes (Signed)
Established patient visit   Patient: Mark Webb   DOB: 01/16/1991   29 y.o. Male  MRN: [id number] Visit Date: 01/18/2020  Today's healthcare provider: Trey Sailors, PA-C   Chief Complaint  Patient presents with  . Flank Pain  I,Porsha C McClurkin,acting as a scribe for Trey Sailors, PA-C.,have documented all relevant documentation on the behalf of Trey Sailors, PA-C,as directed by  Trey Sailors, PA-C while in the presence of Trey Sailors, PA-C.  Subjective    Flank Pain This is a recurrent problem. The current episode started more than 1 month ago. The quality of the pain is described as aching. The symptoms are aggravated by lying down. Pertinent negatives include no abdominal pain, chest pain, fever, numbness, pelvic pain, tingling or weakness. He has tried bed rest for the symptoms.  Patient has a history of constipation. Not currently taking miralax. NO vomiting.       Medications: Outpatient Medications Prior to Visit  Medication Sig  . Omega-3 Fatty Acids (FISH OIL) 1000 MG CAPS Take by mouth.  . [DISCONTINUED] omeprazole (PRILOSEC) 20 MG capsule Take 1 capsule (20 mg total) by mouth daily.   No facility-administered medications prior to visit.    Review of Systems  Constitutional: Negative.  Negative for fever.  Cardiovascular: Negative for chest pain.  Gastrointestinal: Negative for abdominal pain, blood in stool, constipation, diarrhea and vomiting.  Genitourinary: Positive for flank pain. Negative for pelvic pain.  Neurological: Negative for tingling, weakness and numbness.      Objective    BP 136/89 (BP Location: Right Arm, Patient Position: Sitting, Cuff Size: Normal)   Pulse 87   Temp 98.4 F (36.9 C) (Oral)   Wt 186 lb (84.4 kg)   BMI 28.28 kg/m    Physical Exam Constitutional:      Appearance: Normal appearance.  Cardiovascular:     Rate and Rhythm: Normal rate and regular rhythm.     Pulses: Normal pulses.      Heart sounds: Normal heart sounds.  Pulmonary:     Effort: Pulmonary effort is normal.     Breath sounds: Normal breath sounds.  Abdominal:     General: Abdomen is flat. Bowel sounds are normal.     Palpations: Abdomen is soft.  Skin:    General: Skin is warm and dry.  Neurological:     Mental Status: He is alert.       No results found for any visits on 01/18/20.  Assessment & Plan    1. Side pain  Please take capful of miralax daily. KUB declined today. Labs as below. Follow up PRN. - Urine Culture - POCT urinalysis dipstick - Urine Microscopic  2. Gastroesophageal reflux disease, unspecified whether esophagitis present  - omeprazole (PRILOSEC) 20 MG capsule; Take 1 capsule (20 mg total) by mouth daily.  Dispense: 90 capsule; Refill: 0    Return if symptoms worsen or fail to improve.      ITrey Sailors, PA-C, have reviewed all documentation for this visit. The documentation on 02/12/20 for the exam, diagnosis, procedures, and orders are all accurate and complete.  The entirety of the information documented in the History of Present Illness, Review of Systems and Physical Exam were personally obtained by me. Portions of this information were initially documented by [facility] and reviewed by me for thoroughness and accuracy.     Maryella Shivers  [facility] [phone] (phone) [phone] (fax)  [place]

## 2020-01-18 ENCOUNTER — Encounter: Payer: Self-pay | Admitting: Physician Assistant

## 2020-01-18 ENCOUNTER — Ambulatory Visit: Payer: Medicaid Other | Admitting: Physician Assistant

## 2020-01-18 ENCOUNTER — Other Ambulatory Visit: Payer: Self-pay

## 2020-01-18 VITALS — BP 136/89 | HR 87 | Temp 98.4°F | Wt 186.0 lb

## 2020-01-18 DIAGNOSIS — R109 Unspecified abdominal pain: Secondary | ICD-10-CM | POA: Diagnosis not present

## 2020-01-18 DIAGNOSIS — K219 Gastro-esophageal reflux disease without esophagitis: Secondary | ICD-10-CM | POA: Diagnosis not present

## 2020-01-18 LAB — POCT URINALYSIS DIPSTICK
Bilirubin, UA: NEGATIVE
Glucose, UA: NEGATIVE
Ketones, UA: NEGATIVE
Leukocytes, UA: NEGATIVE
Nitrite, UA: NEGATIVE
Protein, UA: POSITIVE — AB
Spec Grav, UA: 1.025 (ref 1.010–1.025)
Urobilinogen, UA: 1 E.U./dL
pH, UA: 6 (ref 5.0–8.0)

## 2020-01-18 MED ORDER — OMEPRAZOLE 20 MG PO CPDR: 20.0000 mg | DELAYED_RELEASE_CAPSULE | Freq: Every day | ORAL | 0 refills | Status: DC

## 2020-01-18 NOTE — Patient Instructions (Addendum)

## 2020-01-19 LAB — URINALYSIS, MICROSCOPIC ONLY
Bacteria, UA: NONE SEEN
Casts: NONE SEEN /lpf
Epithelial Cells (non renal): NONE SEEN /hpf (ref 0–10)
WBC, UA: NONE SEEN /hpf (ref 0–5)

## 2020-01-20 LAB — URINE CULTURE: Organism ID, Bacteria: NO GROWTH

## 2020-01-22 ENCOUNTER — Telehealth: Payer: Self-pay

## 2020-01-22 NOTE — Telephone Encounter (Signed)
Called patient's mother per DRP and left voicemail message for Inetta Fermo to return call. If Inetta Fermo returns call okay for PEC to advise.

## 2020-01-22 NOTE — Telephone Encounter (Signed)
-----   Message from Trey Sailors, [place] sent at 01/21/2020  4:27 PM EDT ----- No bacteria on urine culture.

## 2020-01-23 NOTE — Telephone Encounter (Signed)
Phone call to pt's. Mother, Inetta Fermo.  Left detailed message with result note per Osvaldo Angst from 8/9. (okay to leave detailed message per [facility])  Advised to call office if further questions/ concerns.

## 2020-05-15 ENCOUNTER — Other Ambulatory Visit: Payer: Self-pay

## 2020-05-15 ENCOUNTER — Ambulatory Visit (INDEPENDENT_AMBULATORY_CARE_PROVIDER_SITE_OTHER): Payer: Medicaid Other | Admitting: Family Medicine

## 2020-05-15 ENCOUNTER — Encounter: Payer: Self-pay | Admitting: Family Medicine

## 2020-05-15 DIAGNOSIS — R0981 Nasal congestion: Secondary | ICD-10-CM | POA: Diagnosis not present

## 2020-05-15 DIAGNOSIS — J40 Bronchitis, not specified as acute or chronic: Secondary | ICD-10-CM | POA: Diagnosis not present

## 2020-05-15 MED ORDER — AMOXICILLIN 875 MG PO TABS: 875.0000 mg | ORAL_TABLET | Freq: Two times a day (BID) | ORAL | 0 refills | Status: DC

## 2020-05-15 NOTE — Progress Notes (Signed)
Patient: Mark Webb   DOB: Sep 09, 1990   29 y.o. Male  MRN: [id number] Visit Date: 05/15/2020  Today's healthcare provider: Dortha Kern, PA   Chief Complaint  Patient presents with  . Sinus Problem   Subjective     HPI    Chest congestion, sore throat, cough but denies fever or chills onset 12 days   Last edited by Lavinia Sharps, CMA on 05/15/2020  3:28 PM. (History)      Past Medical History:  Diagnosis Date  . Autism    No past surgical history on file.   Social History   Socioeconomic History  . Marital status: Single    Spouse name: Not on file  . Number of children: Not on file  . Years of education: Not on file  . Highest education level: Not on file  Occupational History  . Not on file  Tobacco Use  . Smoking status: Never Smoker  . Smokeless tobacco: Never Used  Substance and Sexual Activity  . Alcohol use: Yes    Alcohol/week: 0.0 standard drinks  . Drug use: No  . Sexual activity: Not on file  Other Topics Concern  . Not on file  Social History Narrative  . Not on file   Social Determinants of Health   Financial Resource Strain:   . Difficulty of Paying Living Expenses: Not on file  Food Insecurity:   . Worried About Programme researcher, broadcasting/film/video in the Last Year: Not on file  . Ran Out of Food in the Last Year: Not on file  Transportation Needs:   . Lack of Transportation (Medical): Not on file  . Lack of Transportation (Non-Medical): Not on file  Physical Activity:   . Days of Exercise per Week: Not on file  . Minutes of Exercise per Session: Not on file  Stress:   . Feeling of Stress : Not on file  Social Connections:   . Frequency of Communication with Friends and Family: Not on file  . Frequency of Social Gatherings with Friends and Family: Not on file  . Attends Religious Services: Not on file  . Active Member of Clubs or Organizations: Not on file  . Attends Banker Meetings: Not on file  . Marital Status:  Not on file  Intimate Partner Violence:   . Fear of Current or Ex-Partner: Not on file  . Emotionally Abused: Not on file  . Physically Abused: Not on file  . Sexually Abused: Not on file   Family Status  Relation Name Status  . Mother  Alive  . Father  Alive  . Sister  Alive  . Brother  Alive  . MGM  Alive  . MGF  Deceased  . PGM  Alive  . PGF  Deceased   Family History  Problem Relation Age of Onset  . Healthy Mother   . Hypertension Father   . Healthy Sister   . Healthy Brother   . Hypertension Maternal Grandmother   . Thyroid disease Maternal Grandmother   . Lymphoma Maternal Grandmother   . Arthritis Maternal Grandmother   . COPD Maternal Grandfather   . Hypertension Paternal Grandmother   . Diabetes Paternal Grandfather   . COPD Paternal Grandfather    No Known Allergies   Patient Care Team: Jonell Krontz, Jodell Cipro, PA as PCP - General (Physician Assistant)   Medications: Outpatient Medications Prior to Visit  Medication Sig  . co-enzyme Q-10 30 MG capsule  Take 30 mg by mouth 3 (three) times daily.  . Omega-3 Fatty Acids (FISH OIL) 1000 MG CAPS Take by mouth.  Marland Kitchen omeprazole (PRILOSEC) 20 MG capsule Take 1 capsule (20 mg total) by mouth daily.  . Red Yeast Rice Extract (RED YEAST RICE PO) Take by mouth.   No facility-administered medications prior to visit.    Review of Systems    Objective    There were no vitals taken for this visit.   Physical Exam: During telephonic interview, no wheezing or acute respiratory distress.  No results found for any visits on 05/15/20.  Assessment & Plan     1. Head congestion Onset 10 days ago when mother had similar symptoms. No fever or sore throat. Slight raspy voice recently. Continue congestion medications and add Amoxil. Recheck prn. - amoxicillin (AMOXIL) 875 MG tablet; Take 1 tablet (875 mg total) by mouth 2 (two) times daily.  Dispense: 20 tablet; Refill: 0  2. Bronchitis Cough and congestion without fever,  loss of taste or smell over the past 7-10 days. Continue Mucinex Cough and Cold. Add Amoxil for secondary infection. Increase fluid intake and recheck prn. Monitor for COVID symptoms. - amoxicillin (AMOXIL) 875 MG tablet; Take 1 tablet (875 mg total) by mouth 2 (two) times daily.  Dispense: 20 tablet; Refill: 0   No follow-ups on file.     Haywood Pao, PA, have reviewed all documentation for this visit. The documentation on 05/15/20 for the exam, diagnosis, procedures, and orders are all accurate and complete.    Dortha Kern, PA  [facility] [phone] (phone) [phone] (fax)  [facility]

## 2020-07-11 ENCOUNTER — Other Ambulatory Visit: Payer: Self-pay

## 2020-07-11 ENCOUNTER — Ambulatory Visit: Payer: Self-pay | Admitting: *Deleted

## 2020-07-11 ENCOUNTER — Emergency Department: Payer: Medicaid Other

## 2020-07-11 ENCOUNTER — Emergency Department
Admission: EM | Admit: 2020-07-11 | Discharge: 2020-07-11 | Disposition: A | Discharge status: Home / Self Care | Payer: Medicaid Other | Attending: Emergency Medicine | Admitting: Emergency Medicine

## 2020-07-11 DIAGNOSIS — R1013 Epigastric pain: Secondary | ICD-10-CM | POA: Insufficient documentation

## 2020-07-11 DIAGNOSIS — F840 Autistic disorder: Secondary | ICD-10-CM | POA: Insufficient documentation

## 2020-07-11 DIAGNOSIS — R0789 Other chest pain: Secondary | ICD-10-CM | POA: Insufficient documentation

## 2020-07-11 DIAGNOSIS — R079 Chest pain, unspecified: Secondary | ICD-10-CM

## 2020-07-11 LAB — CBC
HCT: 45.6 % (ref 39.0–52.0)
Hemoglobin: 15.7 g/dL (ref 13.0–17.0)
MCH: 29.4 pg (ref 26.0–34.0)
MCHC: 34.4 g/dL (ref 30.0–36.0)
MCV: 85.4 fL (ref 80.0–100.0)
Platelets: 319 10*3/uL (ref 150–400)
RBC: 5.34 MIL/uL (ref 4.22–5.81)
RDW: 12.7 % (ref 11.5–15.5)
WBC: 11 10*3/uL — ABNORMAL HIGH (ref 4.0–10.5)
nRBC: 0 % (ref 0.0–0.2)

## 2020-07-11 LAB — TROPONIN I (HIGH SENSITIVITY)
Troponin I (High Sensitivity): <2 ng/L (ref <18)
Troponin I (High Sensitivity): 4 ng/L (ref <18)

## 2020-07-11 LAB — BASIC METABOLIC PANEL
Anion gap: 14 (ref 5–15)
BUN: 11 mg/dL (ref 6–20)
CO2: 22 mmol/L (ref 22–32)
Calcium: 10.1 mg/dL (ref 8.9–10.3)
Chloride: 99 mmol/L (ref 98–111)
Creatinine, Ser: 0.89 mg/dL (ref 0.61–1.24)
GFR, Estimated: >60 mL/min (ref >60)
Glucose, Bld: 97 mg/dL (ref 70–99)
Potassium: 3.4 mmol/L — ABNORMAL LOW (ref 3.5–5.1)
Sodium: 135 mmol/L (ref 135–145)

## 2020-07-11 LAB — HEPATIC FUNCTION PANEL
ALT: 33 U/L (ref 0–44)
AST: 26 U/L (ref 15–41)
Albumin: 4.4 g/dL (ref 3.5–5.0)
Alkaline Phosphatase: 62 U/L (ref 38–126)
Bilirubin, Direct: <0.1 mg/dL (ref 0.0–0.2)
Total Bilirubin: 1.1 mg/dL (ref 0.3–1.2)
Total Protein: 8.2 g/dL — ABNORMAL HIGH (ref 6.5–8.1)

## 2020-07-11 LAB — CBG MONITORING, ED: Glucose-Capillary: 123 mg/dL — ABNORMAL HIGH (ref 70–99)

## 2020-07-11 LAB — LIPASE, BLOOD: Lipase: 46 U/L (ref 11–51)

## 2020-07-11 MED ORDER — LIDOCAINE VISCOUS HCL 2 % MT SOLN
15.0000 mL | Freq: Once | OROMUCOSAL | Status: AC
  Administered 2020-07-11: 15 mL via ORAL
  Filled 2020-07-11: qty 15

## 2020-07-11 MED ORDER — ALUM & MAG HYDROXIDE-SIMETH 200-200-20 MG/5ML PO SUSP
30.0000 mL | Freq: Once | ORAL | Status: AC
  Administered 2020-07-11: 30 mL via ORAL
  Filled 2020-07-11: qty 30

## 2020-07-11 MED ORDER — PANTOPRAZOLE SODIUM 40 MG PO TBEC: 40.0000 mg | DELAYED_RELEASE_TABLET | Freq: Every day | ORAL | 1 refills | Status: DC

## 2020-07-11 NOTE — Discharge Instructions (Signed)
As we discussed for the next 7 days please take 1 capful of MiraLAX with a large glass of water each morning.  Please use Maalox before bedtime for the next 7 days.  Please take your prescribed medication each morning for the next 2 months.  Return to the emergency department if your pain worsens you develop shortness of breath, or any other symptom personally concerning to yourself.

## 2020-07-11 NOTE — ED Provider Notes (Signed)
[facility] Emergency Department Provider Note  Time seen: 5:56 PM  I have reviewed the triage vital signs and the nursing notes.   HISTORY  Chief Complaint Chest pain  HPI Mark Webb is a 30 y.o. male with a past medical history of autism, constipation, gastric reflux, presents to the emergency department for epigastric discomfort.  According to the patient and mother for the past 2 to 3 days he has been experiencing epigastric discomfort.  Does have a history of heartburn which has felt similar in the past.  Denies any pain into the chest.  Has had a cough with congestion over the past 2 weeks per mom.  States other family members had similar illness at that time and tested negative for Covid.  No recent fever.  Denies any nausea vomiting diarrhea.   Past Medical History:  Diagnosis Date  . Autism     Patient Active Problem List   Diagnosis Date Noted  . Fatty liver 08/14/2019  . Screening, lipid 08/14/2019  . Pain of upper abdomen 07/24/2019  . Gastroesophageal reflux disease 07/24/2019  . Poor historian 07/24/2019  . Constipation 08/05/2016  . Active autistic disorder 01/21/2015  . Chronic intractable headache 01/21/2015  . Migraine 01/21/2015    History reviewed. No pertinent surgical history.  Prior to Admission medications   Medication Sig Start Date End Date Taking? Authorizing Provider  amoxicillin (AMOXIL) 875 MG tablet Take 1 tablet (875 mg total) by mouth 2 (two) times daily. 05/15/20   Chrismon, Jodell Cipro, PA-C  co-enzyme Q-10 30 MG capsule Take 30 mg by mouth 3 (three) times daily.    [provider]  Omega-3 Fatty Acids (FISH OIL) 1000 MG CAPS Take by mouth.    [provider]  omeprazole (PRILOSEC) 20 MG capsule Take 1 capsule (20 mg total) by mouth daily. 01/18/20   Trey Sailors, PA-C  Red Yeast Rice Extract (RED YEAST RICE PO) Take by mouth.    [provider]    No Known Allergies  Family History   Problem Relation Age of Onset  . Healthy Mother   . Hypertension Father   . Healthy Sister   . Healthy Brother   . Hypertension Maternal Grandmother   . Thyroid disease Maternal Grandmother   . Lymphoma Maternal Grandmother   . Arthritis Maternal Grandmother   . COPD Maternal Grandfather   . Hypertension Paternal Grandmother   . Diabetes Paternal Grandfather   . COPD Paternal Grandfather     Social History Social History   Tobacco Use  . Smoking status: Never Smoker  . Smokeless tobacco: Never Used  Substance Use Topics  . Alcohol use: Yes    Alcohol/week: 0.0 standard drinks  . Drug use: No    Review of Systems Constitutional: Negative for fever. Cardiovascular: Negative for chest pain. Respiratory: Negative for shortness of breath. Gastrointestinal: Epigastric pain.  Negative for nausea vomiting or diarrhea. Genitourinary: Negative for urinary compaints Musculoskeletal: Negative for musculoskeletal complaints Neurological: Negative for headache All other ROS negative  ____________________________________________   PHYSICAL EXAM:  VITAL SIGNS: ED Triage Vitals  Enc Vitals Group     BP 07/11/20 1634 (!) 143/92     Pulse Rate 07/11/20 1634 (!) 105     Resp 07/11/20 1634 20     Temp 07/11/20 1634 98.4 F (36.9 C)     Temp Source 07/11/20 1634 Oral     SpO2 07/11/20 1634 98 %     Weight 07/11/20 1635  191 lb (86.6 kg)     Height 07/11/20 1635 5\' 7"  (1.702 m)     Head Circumference --      Peak Flow --      Pain Score 07/11/20 1645 3     Pain Loc --      Pain Edu? --      Excl. in GC? --     Constitutional: Alert and oriented. Well appearing and in no distress. Eyes: Normal exam ENT      Head: Normocephalic and atraumatic.      Mouth/Throat: Mucous membranes are moist. Cardiovascular: Normal rate, regular rhythm.  Respiratory: Normal respiratory effort without tachypnea nor retractions. Breath sounds are clear.  Mild central lower chest tenderness to  palpation. Gastrointestinal: Soft, mild epigastric tenderness palpation without rebound guarding or distention.  Mild central lower chest tenderness as well. Musculoskeletal: Nontender with normal range of motion in all extremities. No lower extremity tenderness or edema. Neurologic:  Normal speech and language. No gross focal neurologic deficits  Skin:  Skin is warm, dry and intact.  Psychiatric: Mood and affect are normal.   ____________________________________________    EKG  EKG viewed and interpreted by myself shows sinus tachycardia 110 bpm with a narrow QRS, normal axis, normal intervals, nonspecific ST changes.  ____________________________________________    RADIOLOGY  Chest x-ray shows no acute abnormality.  ____________________________________________   INITIAL IMPRESSION / ASSESSMENT AND PLAN / ED COURSE  Pertinent labs & imaging results that were available during my care of the patient were reviewed by me and considered in my medical decision making (see chart for details).   Patient presents to the emergency department for epigastric/lower central chest discomfort over the past 2 to 3 days.  States has been fairly constant.  Does have a history of gastric reflux.  Denies any aggravating factors such as food intake deep breathing, coughing.  Overall the patient appears well.  Reassuring work-up thus far including negative cardiac enzymes.  I have added on a hepatic function panel and lipase as a precaution.  We will dose a GI cocktail and reassess.  Differential would include gastric reflux, gastritis, esophagitis, ACS, pleurisy.  Chest x-ray reassuringly negative.  Lipase and hepatic function panel is normal in addition to the rest of his labs including cardiac enzyme.  Patient states minimal relief after GI cocktail.  Given the patient's reassuring work-up vitals and physical exam I believe the patient is safe for discharge home.  We will discharge with Protonix I did  discuss with mom a trial of MiraLAX for the next 7 days as well as Maalox.  Patient agreeable to plan of care.  Mark Webb was evaluated in Emergency Department on 07/11/2020 for the symptoms described in the history of present illness. He was evaluated in the context of the global COVID-19 pandemic, which necessitated consideration that the patient might be at risk for infection with the SARS-CoV-2 virus that causes COVID-19. Institutional protocols and algorithms that pertain to the evaluation of patients at risk for COVID-19 are in a state of rapid change based on information released by regulatory bodies including the CDC and federal and state organizations. These policies and algorithms were followed during the patient's care in the ED.  ____________________________________________   FINAL CLINICAL IMPRESSION(S) / ED DIAGNOSES  Epigastric pain Chest pain   07/13/2020, MD 07/11/20 [phone]

## 2020-07-11 NOTE — ED Notes (Signed)
This RN agrees with triage assessment, pt denies any further complaints.

## 2020-07-11 NOTE — ED Triage Notes (Signed)
Pt here with mom via POV.  Mom reports pt has been experiencing nasal and chest congestion x14 days. No known fevers. Today he has been experiencing centralized chest pain, non-radiating some shortness of breath. Pain increases with exertion, decreases with rest. Denies nausea.   Unknown if any covid contacts. Has not been tested.

## 2020-07-11 NOTE — ED Notes (Signed)
No peripheral IV placed this visit.    Discharge instructions reviewed with patient. Questions fielded by this RN. Patient verbalizes understanding of instructions. Patient discharged home in stable condition per provider. No acute distress noted at time of discharge.    Pt ambulatory to lobby 

## 2020-07-11 NOTE — Telephone Encounter (Signed)
Patient and mother are on the phone- patient has started complaining of chest pain for 2 days now. He has had a cough for a week and states she is coughing up yellow phlegm. Chest pain is midline upper pain that does not radiate and does not get worse with exertion. Patient does state he has SOB with exertion. Advised ED/UC for evaluation.  Reason for Disposition . [1] Chest pain lasts > 5 minutes AND [2] occurred in past 3 days (72 hours) (Exception: feels exactly the same as previously diagnosed heartburn and has accompanying sour taste in mouth) . [1] MILD difficulty breathing (e.g., minimal/no SOB at rest, SOB with walking, pulse <100) AND [2] still present when not coughing  Answer Assessment - Initial Assessment Questions 1. ONSET: "When did the cough begin?"      2 weeks 2. SEVERITY: "How bad is the cough today?"      Off/on all day- wakes up at night- spells 3. SPUTUM: "Describe the color of your sputum" (none, dry cough; clear, white, yellow, green)     yellow 4. HEMOPTYSIS: "Are you coughing up any blood?" If so ask: "How much?" (flecks, streaks, tablespoons, etc.)     no 5. DIFFICULTY BREATHING: "Are you having difficulty breathing?" If Yes, ask: "How bad is it?" (e.g., mild, moderate, severe)    - MILD: No SOB at rest, mild SOB with walking, speaks normally in sentences, can lay down, no retractions, pulse < 100.    - MODERATE: SOB at rest, SOB with minimal exertion and prefers to sit, cannot lie down flat, speaks in phrases, mild retractions, audible wheezing, pulse 100-120.    - SEVERE: Very SOB at rest, speaks in single words, struggling to breathe, sitting hunched forward, retractions, pulse > 120      mild 6. FEVER: "Do you have a fever?" If Yes, ask: "What is your temperature, how was it measured, and when did it start?"     no 7. CARDIAC HISTORY: "Do you have any history of heart disease?" (e.g., heart attack, congestive heart failure)      no 8. LUNG HISTORY: "Do you have  any history of lung disease?"  (e.g., pulmonary embolus, asthma, emphysema)     no 9. PE RISK FACTORS: "Do you have a history of blood clots?" (or: recent major surgery, recent prolonged travel, bedridden)     no 10. OTHER SYMPTOMS: "Do you have any other symptoms?" (e.g., runny nose, wheezing, chest pain)       Chest pain- middle - chest pain with deep breath 11. PREGNANCY: "Is there any chance you are pregnant?" "When was your last menstrual period?"       n/a 12. TRAVEL: "Have you traveled out of the country in the last month?" (e.g., travel history, exposures)       no  Answer Assessment - Initial Assessment Questions 1. LOCATION: "Where does it hurt?"       Middle up top 2. RADIATION: "Does the pain go anywhere else?" (e.g., into neck, jaw, arms, back)     no 3. ONSET: "When did the chest pain begin?" (Minutes, hours or days)      1 week- 2 days 4. PATTERN "Does the pain come and go, or has it been constant since it started?"  "Does it get worse with exertion?"      Constant, worse when in the bed 5. DURATION: "How long does it last" (e.g., seconds, minutes, hours)     hours 6. SEVERITY: "How bad is  the pain?"  (e.g., Scale 1-10; mild, moderate, or severe)    - MILD (1-3): doesn't interfere with normal activities     - MODERATE (4-7): interferes with normal activities or awakens from sleep    - SEVERE (8-10): excruciating pain, unable to do any normal activities       moderate 7. CARDIAC RISK FACTORS: "Do you have any history of heart problems or risk factors for heart disease?" (e.g., angina, prior heart attack; diabetes, high blood pressure, high cholesterol, smoker, or strong family history of heart disease)     High cholestrol 8. PULMONARY RISK FACTORS: "Do you have any history of lung disease?"  (e.g., blood clots in lung, asthma, emphysema, birth control pills)     no 9. CAUSE: "What do you think is causing the chest pain?"     n/a 10. OTHER SYMPTOMS: "Do you have any  other symptoms?" (e.g., dizziness, nausea, vomiting, sweating, fever, difficulty breathing, cough)       Cough, headcahe 11. PREGNANCY: "Is there any chance you are pregnant?" "When was your last menstrual period?"       n/a  Protocols used: CHEST PAIN-A-AH, COUGH - ACUTE PRODUCTIVE-A-AH

## 2020-07-11 NOTE — Telephone Encounter (Signed)
FYI

## 2020-07-13 ENCOUNTER — Encounter: Payer: Self-pay | Admitting: Emergency Medicine

## 2020-07-13 ENCOUNTER — Emergency Department: Payer: Medicaid Other

## 2020-07-13 ENCOUNTER — Emergency Department
Admission: EM | Admit: 2020-07-13 | Discharge: 2020-07-13 | Disposition: A | Discharge status: Home / Self Care | Payer: Medicaid Other | Attending: Emergency Medicine | Admitting: Emergency Medicine

## 2020-07-13 ENCOUNTER — Other Ambulatory Visit: Payer: Self-pay

## 2020-07-13 DIAGNOSIS — R079 Chest pain, unspecified: Secondary | ICD-10-CM | POA: Insufficient documentation

## 2020-07-13 DIAGNOSIS — K219 Gastro-esophageal reflux disease without esophagitis: Secondary | ICD-10-CM | POA: Diagnosis not present

## 2020-07-13 DIAGNOSIS — R1013 Epigastric pain: Secondary | ICD-10-CM | POA: Diagnosis not present

## 2020-07-13 LAB — CBC
HCT: 43.1 % (ref 39.0–52.0)
Hemoglobin: 15.1 g/dL (ref 13.0–17.0)
MCH: 29.8 pg (ref 26.0–34.0)
MCHC: 35 g/dL (ref 30.0–36.0)
MCV: 85 fL (ref 80.0–100.0)
Platelets: 311 10*3/uL (ref 150–400)
RBC: 5.07 MIL/uL (ref 4.22–5.81)
RDW: 12.8 % (ref 11.5–15.5)
WBC: 7.9 10*3/uL (ref 4.0–10.5)
nRBC: 0 % (ref 0.0–0.2)

## 2020-07-13 LAB — HEPATIC FUNCTION PANEL
ALT: 23 U/L (ref 0–44)
AST: 21 U/L (ref 15–41)
Albumin: 4.3 g/dL (ref 3.5–5.0)
Alkaline Phosphatase: 54 U/L (ref 38–126)
Bilirubin, Direct: <0.1 mg/dL (ref 0.0–0.2)
Total Bilirubin: 0.8 mg/dL (ref 0.3–1.2)
Total Protein: 7.8 g/dL (ref 6.5–8.1)

## 2020-07-13 LAB — BASIC METABOLIC PANEL
Anion gap: 14 (ref 5–15)
BUN: 13 mg/dL (ref 6–20)
CO2: 22 mmol/L (ref 22–32)
Calcium: 9.8 mg/dL (ref 8.9–10.3)
Chloride: 103 mmol/L (ref 98–111)
Creatinine, Ser: 0.81 mg/dL (ref 0.61–1.24)
GFR, Estimated: >60 mL/min (ref >60)
Glucose, Bld: 94 mg/dL (ref 70–99)
Potassium: 3.7 mmol/L (ref 3.5–5.1)
Sodium: 139 mmol/L (ref 135–145)

## 2020-07-13 LAB — LIPASE, BLOOD: Lipase: 40 U/L (ref 11–51)

## 2020-07-13 LAB — TROPONIN I (HIGH SENSITIVITY)
Troponin I (High Sensitivity): <2 ng/L (ref <18)
Troponin I (High Sensitivity): <2 ng/L (ref <18)

## 2020-07-13 MED ORDER — METOCLOPRAMIDE HCL 5 MG/ML IJ SOLN
10.0000 mg | Freq: Once | INTRAMUSCULAR | Status: AC
  Administered 2020-07-13: 10 mg via INTRAVENOUS
  Filled 2020-07-13: qty 2

## 2020-07-13 MED ORDER — METOCLOPRAMIDE HCL 10 MG PO TABS: 10.0000 mg | ORAL_TABLET | Freq: Three times a day (TID) | ORAL | 0 refills | Status: DC

## 2020-07-13 MED ORDER — DIPHENHYDRAMINE HCL 50 MG/ML IJ SOLN
12.5000 mg | Freq: Once | INTRAMUSCULAR | Status: AC
  Administered 2020-07-13: 12.5 mg via INTRAVENOUS
  Filled 2020-07-13: qty 1

## 2020-07-13 MED ORDER — IOHEXOL 350 MG/ML SOLN
100.0000 mL | Freq: Once | INTRAVENOUS | Status: AC | PRN
  Administered 2020-07-13: 100 mL via INTRAVENOUS
  Filled 2020-07-13: qty 100

## 2020-07-13 MED ORDER — SODIUM CHLORIDE 0.9 % IV BOLUS
1000.0000 mL | Freq: Once | INTRAVENOUS | Status: AC
  Administered 2020-07-13: 1000 mL via INTRAVENOUS

## 2020-07-13 NOTE — ED Notes (Signed)
Patient is complaining of mid-sternal chest pain x 5 days.  Patient states pain has been increasing.  Patient states it is somewhat worse with movement.

## 2020-07-13 NOTE — ED Triage Notes (Signed)
Pt mom reports pt with CP. Pt was seen here for the same on 1/28 and was d/c with some antacids but pt reports to mom that the pain is unbearable. Pt tearful in triage and reports that it hurts really bad.

## 2020-07-13 NOTE — Discharge Instructions (Signed)
Follow up with the GI doctor and primary care.  Continue to give him the omeprazole and start Reglan as well.

## 2020-07-13 NOTE — ED Provider Notes (Signed)
[facility] Emergency Department Provider Note ____________________________________________   Event Date/Time   First MD Initiated Contact with Patient 07/13/20 1316     (approximate)  I have reviewed the triage vital signs and the nursing notes.   HISTORY  Chief Complaint Chest Pain  HPI Mark Webb is a 30 y.o. male with history of autism presents to the emergency department for treatment and evaluation midsternal/epigastric pain for the past 5 days.  He was evaluated here for the same on 07/11/2020 and diagnosed with GERD. Mom states that today he has been complaining of worsening pain.         Past Medical History:  Diagnosis Date  . Autism     Patient Active Problem List   Diagnosis Date Noted  . Fatty liver 08/14/2019  . Screening, lipid 08/14/2019  . Pain of upper abdomen 07/24/2019  . Gastroesophageal reflux disease 07/24/2019  . Poor historian 07/24/2019  . Constipation 08/05/2016  . Active autistic disorder 01/21/2015  . Chronic intractable headache 01/21/2015  . Migraine 01/21/2015    History reviewed. No pertinent surgical history.  Prior to Admission medications   Medication Sig Start Date End Date Taking? Authorizing Provider  metoCLOPramide (REGLAN) 10 MG tablet Take 1 tablet (10 mg total) by mouth 3 (three) times daily with meals. 07/13/20 07/13/21 Yes Darnesha Diloreto B, FNP  amoxicillin (AMOXIL) 875 MG tablet Take 1 tablet (875 mg total) by mouth 2 (two) times daily. 05/15/20   Chrismon, Jodell Cipro, PA-C  co-enzyme Q-10 30 MG capsule Take 30 mg by mouth 3 (three) times daily.    [provider]  Omega-3 Fatty Acids (FISH OIL) 1000 MG CAPS Take by mouth.    [provider]  omeprazole (PRILOSEC) 20 MG capsule Take 1 capsule (20 mg total) by mouth daily. 01/18/20   Trey Sailors, PA-C  pantoprazole (PROTONIX) 40 MG tablet Take 1 tablet (40 mg total) by mouth daily. 07/11/20 07/11/21  Minna Antis, MD  Red  Yeast Rice Extract (RED YEAST RICE PO) Take by mouth.    [provider]    Allergies Patient has no known allergies.  Family History  Problem Relation Age of Onset  . Healthy Mother   . Hypertension Father   . Healthy Sister   . Healthy Brother   . Hypertension Maternal Grandmother   . Thyroid disease Maternal Grandmother   . Lymphoma Maternal Grandmother   . Arthritis Maternal Grandmother   . COPD Maternal Grandfather   . Hypertension Paternal Grandmother   . Diabetes Paternal Grandfather   . COPD Paternal Grandfather     Social History Social History   Tobacco Use  . Smoking status: Never Smoker  . Smokeless tobacco: Never Used  Substance Use Topics  . Alcohol use: Yes    Alcohol/week: 0.0 standard drinks  . Drug use: No    Review of Systems  Constitutional: No fever/chills Eyes: No visual changes. ENT: No sore throat. Cardiovascular:Positive for chest pain. Respiratory: Denies shortness of breath. Gastrointestinal: Positive for epigastric pain.  No diarrhea.  No constipation. Genitourinary: Negative for dysuria. Musculoskeletal: Positive for back pain. Skin: Negative for rash. Neurological: Negative for headaches, focal weakness or numbness. ____________________________________________   PHYSICAL EXAM:  VITAL SIGNS: ED Triage Vitals  Enc Vitals Group     BP 07/13/20 1116 125/81     Pulse Rate 07/13/20 1116 83     Resp 07/13/20 1116 20     Temp 07/13/20 1116 98 F (36.7  C)     Temp Source 07/13/20 1116 Oral     SpO2 07/13/20 1116 99 %     Weight 07/13/20 1057 190 lb 14.7 oz (86.6 kg)     Height 07/13/20 1057 5\' 7"  (1.702 m)     Head Circumference --      Peak Flow --      Pain Score 07/13/20 1057 10     Pain Loc --      Pain Edu? --      Excl. in GC? --     Constitutional: Alert and oriented. Uncomfortable appearing and in no acute distress. Eyes: Conjunctivae are normal. Head: Atraumatic. Nose: No  congestion/rhinnorhea. Mouth/Throat: Mucous membranes are moist.  Oropharynx non-erythematous. Neck: No stridor.   Hematological/Lymphatic/Immunilogical: No cervical lymphadenopathy. Cardiovascular: Normal rate, regular rhythm. Grossly normal heart sounds.  Good peripheral circulation. Respiratory: Normal respiratory effort.  No retractions. Lungs CTAB. Gastrointestinal: Soft and nontender. No distention. No abdominal bruits. No CVA tenderness. Genitourinary:  Musculoskeletal: No lower extremity tenderness nor edema.  No joint effusions. Neurologic:  Normal speech and language. No gross focal neurologic deficits are appreciated. No gait instability. Skin:  Skin is warm, dry and intact. No rash noted. Psychiatric: Mood and affect are normal. Speech and behavior are normal.  ____________________________________________   LABS (all labs ordered are listed, but only abnormal results are displayed)  Labs Reviewed  BASIC METABOLIC PANEL  CBC  HEPATIC FUNCTION PANEL  LIPASE, BLOOD  TROPONIN I (HIGH SENSITIVITY)  TROPONIN I (HIGH SENSITIVITY)   ____________________________________________ ____________________________________________  RADIOLOGY  ED MD interpretation:    Chest x-ray negative for acute cardiopulmonary abnormality.  Right upper quadrant abdominal ultrasound shows a polyp in the gallbladder but otherwise is negative for acute concerns or findings per radiology.  CTA of the chest negative for acute concerns.  I, 07/15/20, personally viewed and evaluated these images (plain radiographs) as part of my medical decision making, as well as reviewing the written report by the radiologist.  Official radiology report(s): DG Chest 2 View  Result Date: 07/13/2020 CLINICAL DATA:  Chest pain EXAM: CHEST - 2 VIEW COMPARISON:  Chest radiograph dated 07/11/2020. FINDINGS: The heart size and mediastinal contours are within normal limits. Both lungs are clear. The visualized  skeletal structures are unremarkable. IMPRESSION: No active cardiopulmonary disease. Electronically Signed   By: 07/13/2020 M.D.   On: 07/13/2020 13:36   CT Angio Chest PE W and/or Wo Contrast  Result Date: 07/13/2020 CLINICAL DATA:  Chest pain.  Pleurisy. EXAM: CT ANGIOGRAPHY CHEST WITH CONTRAST TECHNIQUE: Multidetector CT imaging of the chest was performed using the standard protocol during bolus administration of intravenous contrast. Multiplanar CT image reconstructions and MIPs were obtained to evaluate the vascular anatomy. CONTRAST:  07/15/2020 OMNIPAQUE IOHEXOL 350 MG/ML SOLN COMPARISON:  None. FINDINGS: Cardiovascular: Contrast injection is sufficient to demonstrate satisfactory opacification of the pulmonary arteries to the segmental level. There is no pulmonary embolus or evidence of right heart strain. The size of the main pulmonary artery is normal. Heart size is normal, with no pericardial effusion. The course and caliber of the aorta are normal. There is no atherosclerotic calcification. Opacification decreased due to pulmonary arterial phase contrast bolus timing. Mediastinum/Nodes: -- No mediastinal lymphadenopathy. -- No hilar lymphadenopathy. -- No axillary lymphadenopathy. -- No supraclavicular lymphadenopathy. -- Normal thyroid gland where visualized. -  Unremarkable esophagus. Lungs/Pleura: Airways are patent. No pleural effusion, lobar consolidation, pneumothorax or pulmonary infarction. Upper Abdomen: Contrast bolus timing is not optimized for  evaluation of the abdominal organs. The visualized portions of the organs of the upper abdomen are normal. Musculoskeletal: No chest wall abnormality. No bony spinal canal stenosis. Review of the MIP images confirms the above findings. IMPRESSION: No evidence of pulmonary embolism or other acute intrathoracic process. Electronically Signed   By: Katherine Mantle M.D.   On: 07/13/2020 17:09   US Abdomen Limited RUQ (LIVER/GB)  Result Date:  07/13/2020 CLINICAL DATA:  Acute epigastric and chest pain for 5 days EXAM: ULTRASOUND ABDOMEN LIMITED RIGHT UPPER QUADRANT COMPARISON:  07/26/2019 FINDINGS: Gallbladder: Normally distended without stones or wall thickening. 4 mm diameter gallbladder polyp visualized; no follow-up imaging recommended. This recommendation follows ACR consensus guidelines: White Paper of the ACR Incidental Findings Committee II on Gallbladder and Biliary Findings. J Am Coll Radiol 2013:;10:953-956. No peristalsis cystic fluid or sonographic Murphy sign. Common bile duct: Diameter: 4 mm, normal Liver: Normal echogenicity without mass or nodularity. Portal vein is patent on color Doppler imaging with normal direction of blood flow towards the liver. Other: No RIGHT upper quadrant free fluid. IMPRESSION: 4 mm gallbladder polyp; no follow-up imaging recommended as above. Otherwise negative exam. Electronically Signed   By: Ulyses Southward M.D.   On: 07/13/2020 15:01    ____________________________________________   PROCEDURES  Procedure(s) performed (including Critical Care):  Procedures  ____________________________________________   INITIAL IMPRESSION / ASSESSMENT AND PLAN     30 year old male presenting to the emergency department for treatment and evaluation of midsternal/epigastric pain x5 days.  See HPI for further details.  Plan will be to review details from his last ER visit and await protocol lab studies started while in triage.  DIFFERENTIAL DIAGNOSIS  GERD, acute cholecystitis, cholelithiasis, PE  ED COURSE  On exam, the patient does to be in significant pain.  He is autistic but communicates fairly well.  Palpation of the right upper abdomen patient indicated was tender.  When asked if he ever has pain in the subscapular area he nods his head yes.  He is able to describe current pain as squeezing or cramping type pain.  He also states that the pain is in the mid chest and abdomen.  Plan will be to start  with a right upper quadrant ultrasound to rule out acute cholecystitis or cholelithiasis.  Reglan and Benadryl ordered as a trial for symptom control.  Ultrasound is negative for gallbladder disease.  Patient states that after he received medication the pain in his epigastric area went away but he continues to have some pain in the mid chest.  Mom remains concerned that we have not identified the exact cause of pain and would like to proceed with the CT a to rule out any other emergent findings.  This seems reasonable as the patient does have some limitations in reporting his symptoms.  He does not appear to be short of breath and has had a negative COVID-19 test.  He does report some pain with deep inspiration.  Will proceed with CTA.  CTA is negative.  Symptoms are likely related to peptic ulcer or gastric reflux.  Since he did receive significant relief after Reglan, he will receive a prescription for the same.  Mom was encouraged to also continue giving him his PPI.  Referral to GI will be given at discharge.  They are also to follow-up with primary care for any symptoms that change, worsen, or for new concerns.  Mom felt reassured with the extensive testing tonight.    ___________________________________________   FINAL CLINICAL  IMPRESSION(S) / ED DIAGNOSES  Final diagnoses:  Acute epigastric pain     ED Discharge Orders         Ordered    metoCLOPramide (REGLAN) 10 MG tablet  3 times daily with meals        07/13/20 1728           Mark Webb was evaluated in Emergency Department on 07/13/2020 for the symptoms described in the history of present illness. He was evaluated in the context of the global COVID-19 pandemic, which necessitated consideration that the patient might be at risk for infection with the SARS-CoV-2 virus that causes COVID-19. Institutional protocols and algorithms that pertain to the evaluation of patients at risk for COVID-19 are in a state of rapid change  based on information released by regulatory bodies including the CDC and federal and state organizations. These policies and algorithms were followed during the patient's care in the ED.   Note:  This document was prepared using Dragon voice recognition software and may include unintentional dictation errors.   Chinita Pester, FNP 07/13/20 Brooke Pace    Minna Antis, MD 07/15/20 2217

## 2020-11-25 ENCOUNTER — Ambulatory Visit: Payer: Medicaid Other | Admitting: Family Medicine

## 2021-11-25 IMAGING — CR DG CHEST 2V
1 series · 2 of 2 positions shown · non-contrast
Comparison: Chest radiograph dated 07/11/2020.

CLINICAL DATA: Chest pain

EXAM:
CHEST - 2 VIEW

[Series 1: dg chest 2 view · 0.14mm/px · 2 of 2 slices shown]
[im 1/2]
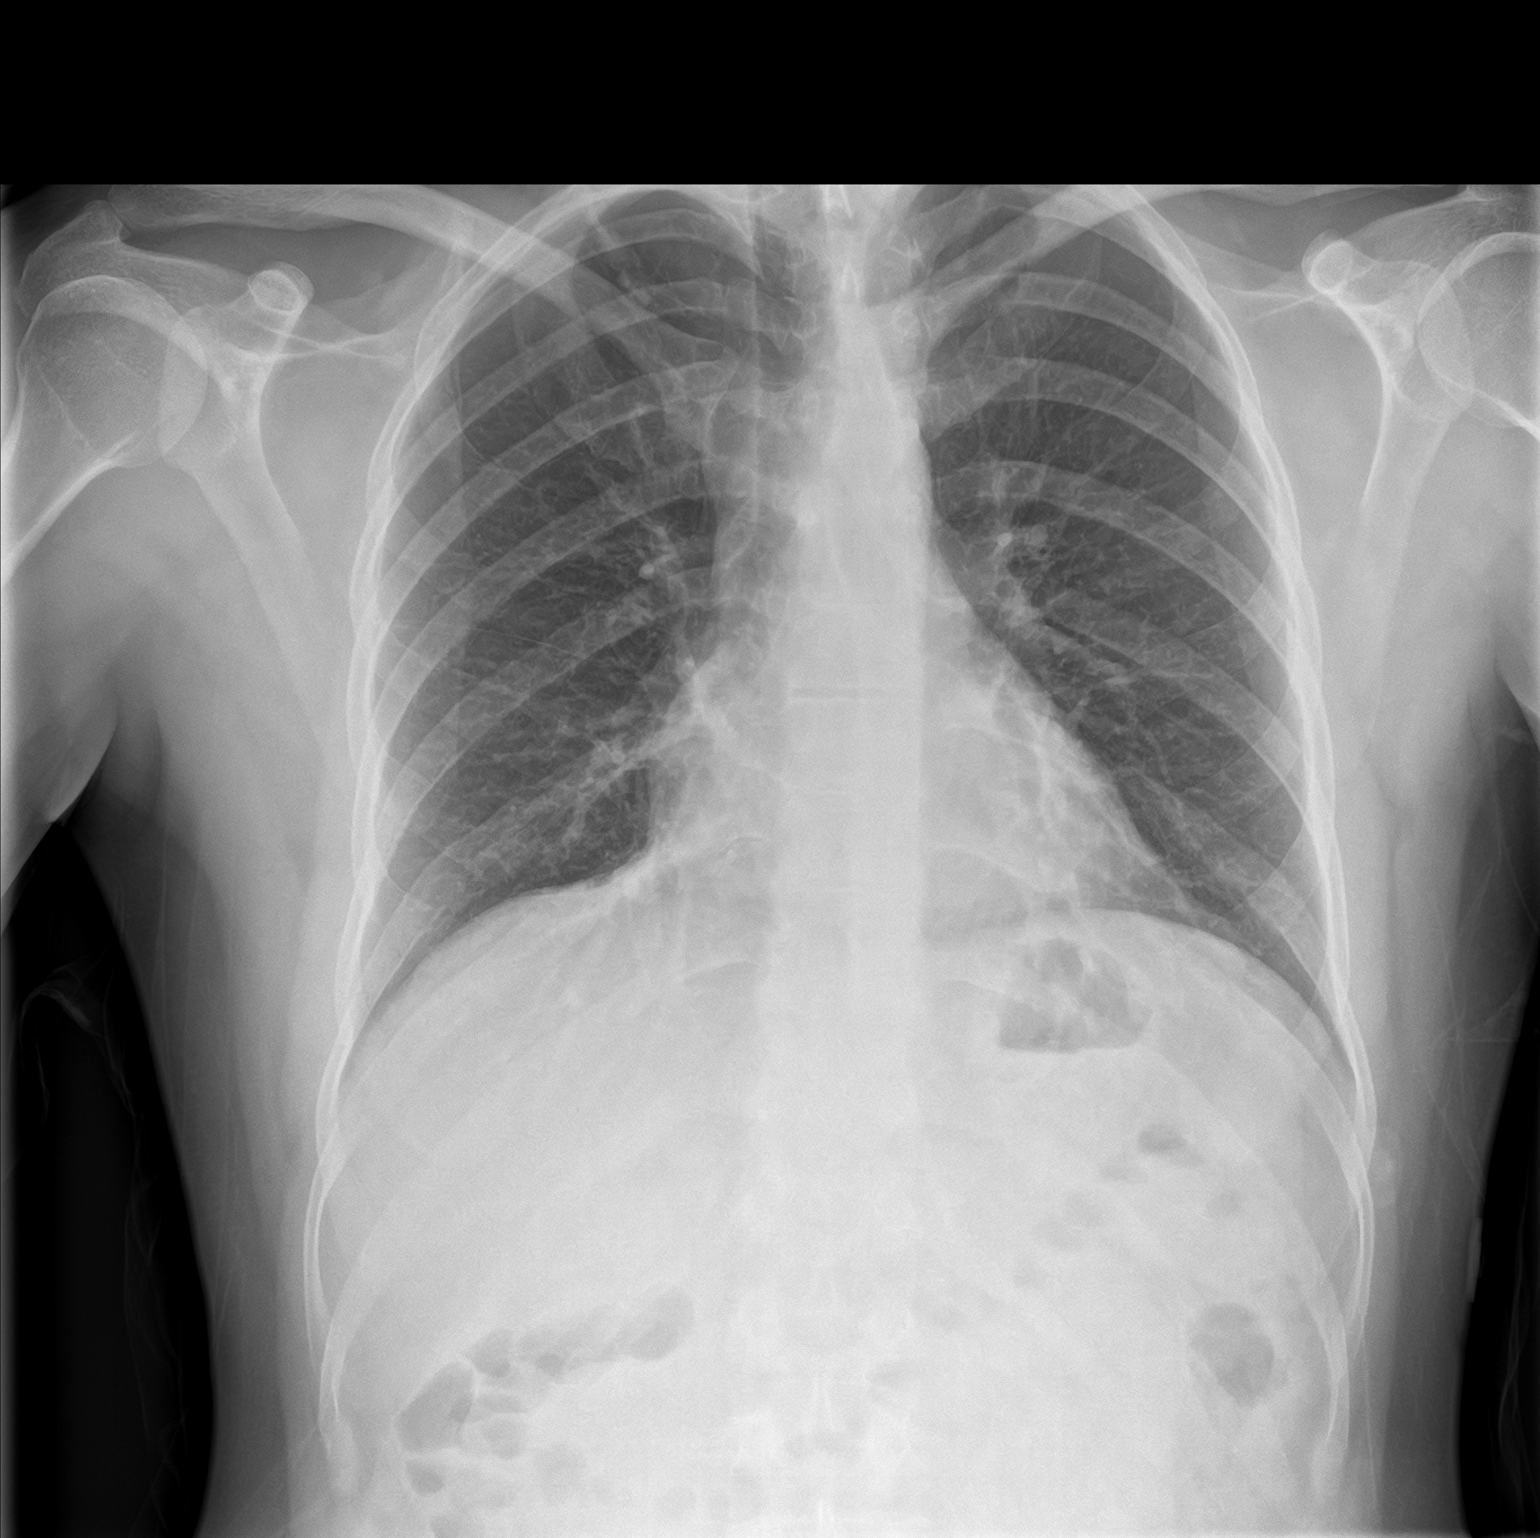
[im 2/2]
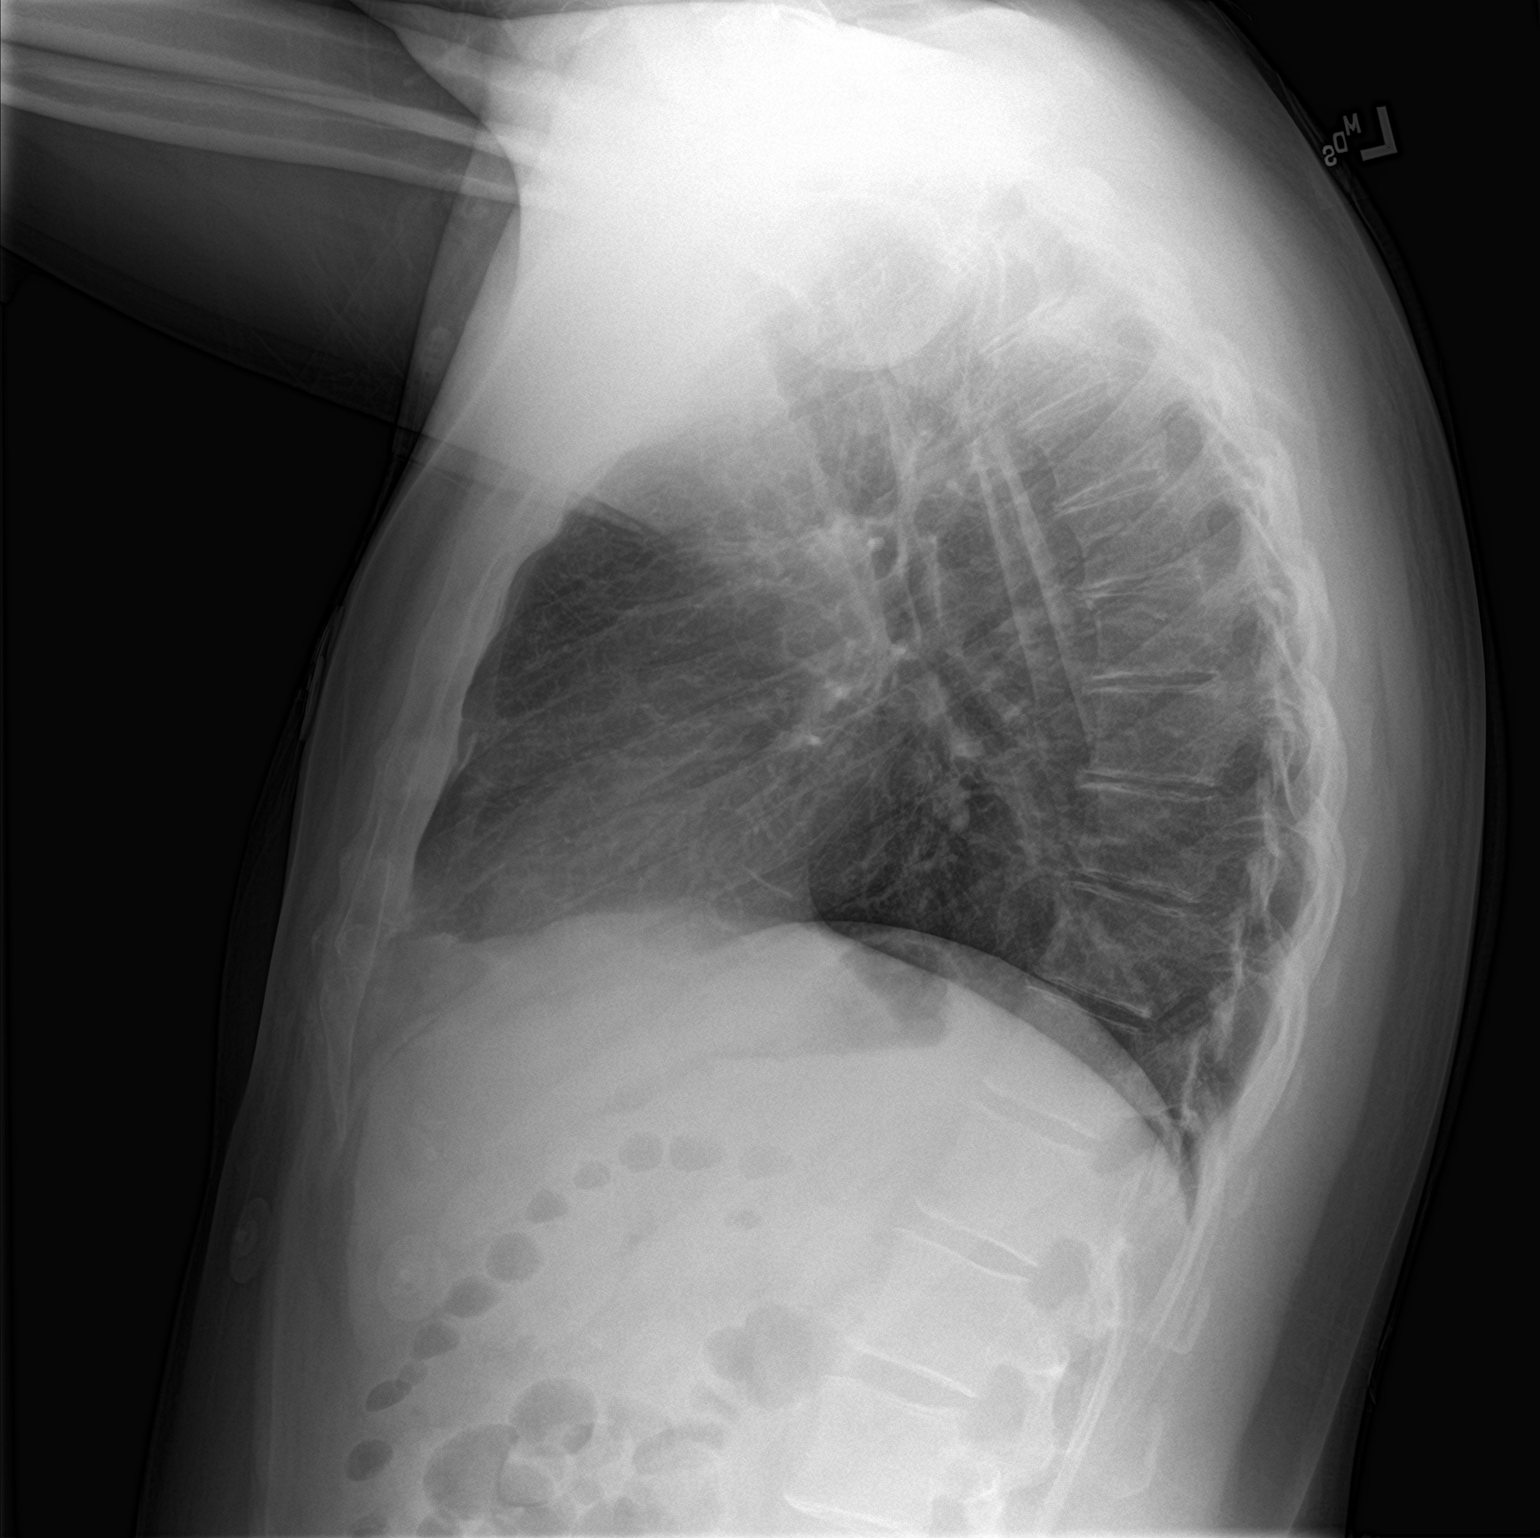

[2 of 2 positions shown; findings below may reference images not displayed]

FINDINGS: The heart size and mediastinal contours are within normal limits.
Both lungs are clear. The visualized skeletal structures are
unremarkable.
IMPRESSION: No active cardiopulmonary disease.

## 2021-11-25 IMAGING — CT CT ANGIO CHEST
2 of 6 series · 18 of 46 positions shown · IV contrast (APPLIED)
Comparison: None.

CLINICAL DATA: Chest pain.  Pleurisy.

EXAM:
CT ANGIOGRAPHY CHEST WITH CONTRAST
TECHNIQUE: Multidetector CT imaging of the chest was performed using the
standard protocol during bolus administration of intravenous
contrast. Multiplanar CT image reconstructions and MIPs were
obtained to evaluate the vascular anatomy.
CONTRAST:  100mL OMNIPAQUE IOHEXOL 350 MG/ML SOLN

[Series 5: thins · axial · 0.68mm/px · z∈[-586,-344]mm · 15 of 266 slices shown]
[im 12/266  lung]
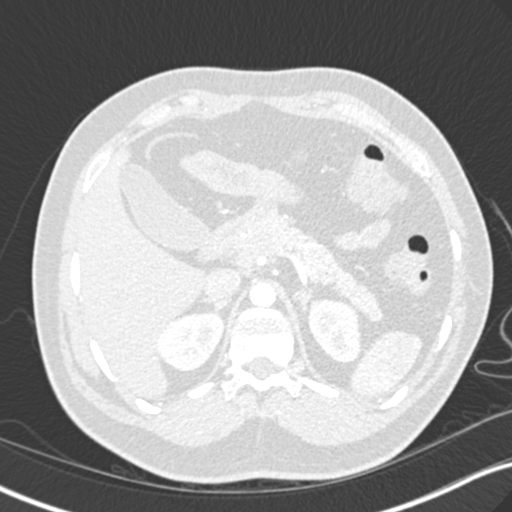
[im 35/266  soft-tissue]
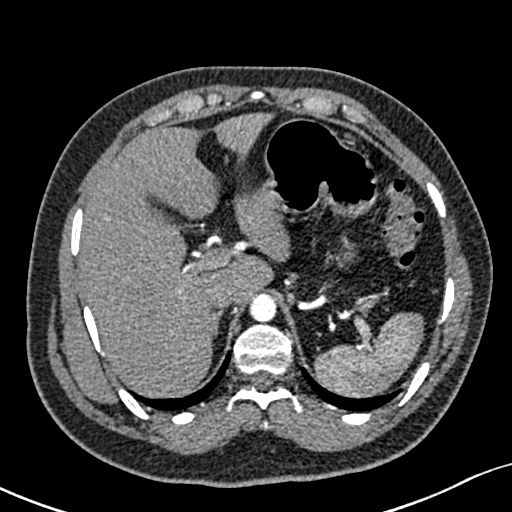
[im 47/266  lung]
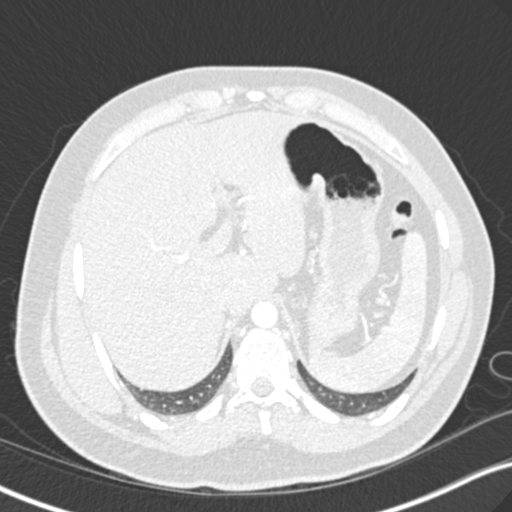
[im 70/266  soft-tissue]
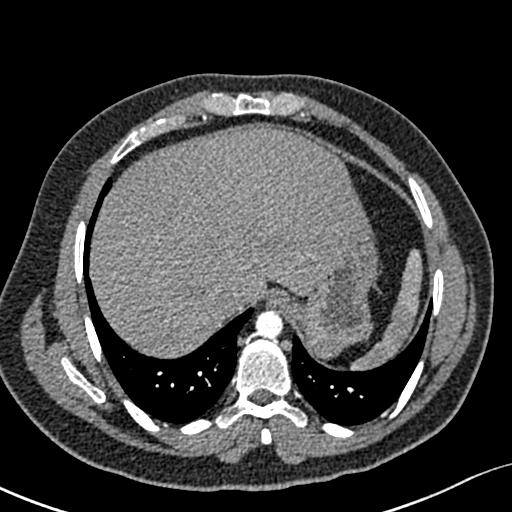
[im 81/266  lung]
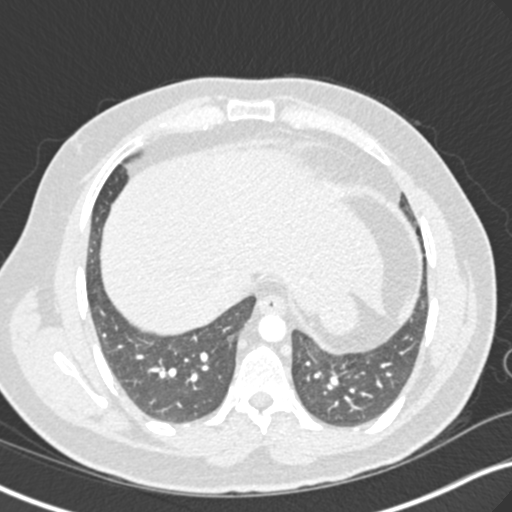
[im 104/266  soft-tissue]
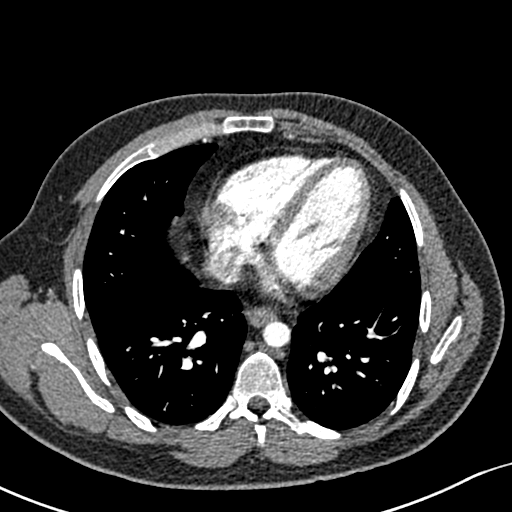
[im 116/266  lung]
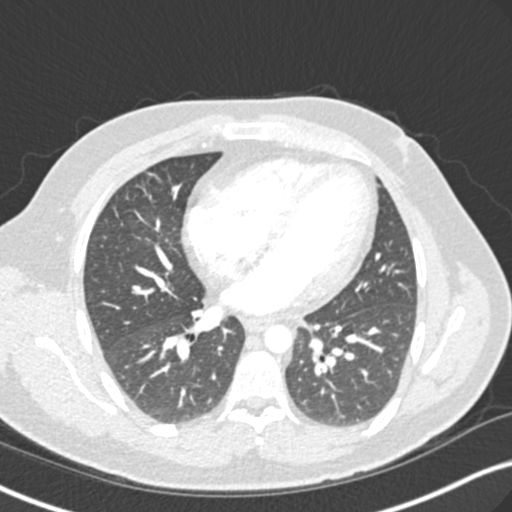
[im 139/266  soft-tissue]
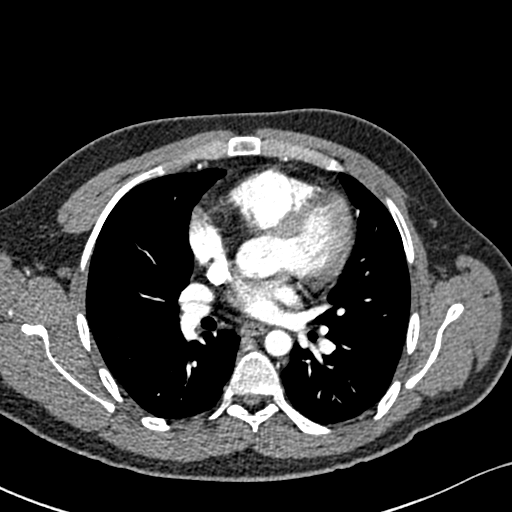
[im 150/266  lung]
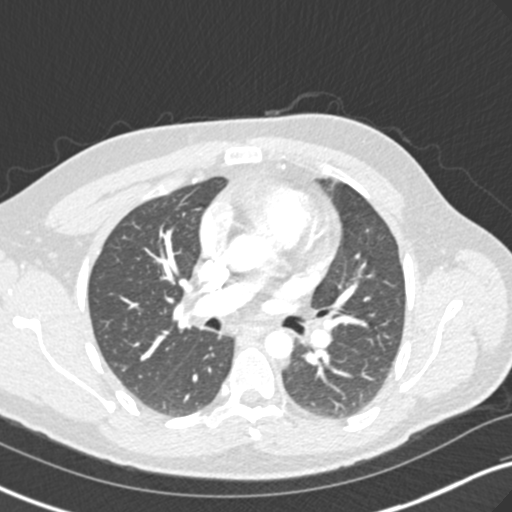
[im 162/266  soft-tissue]
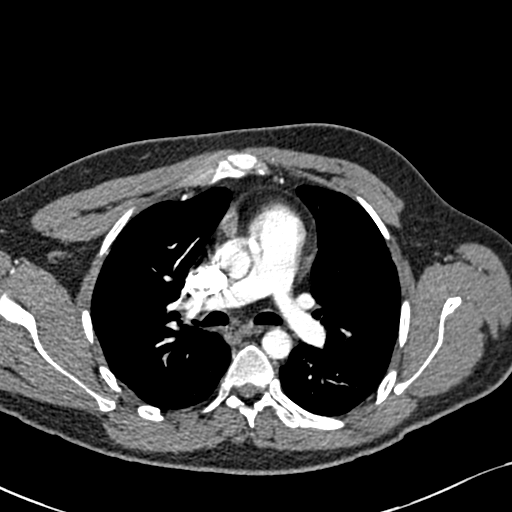
[im 185/266  lung]
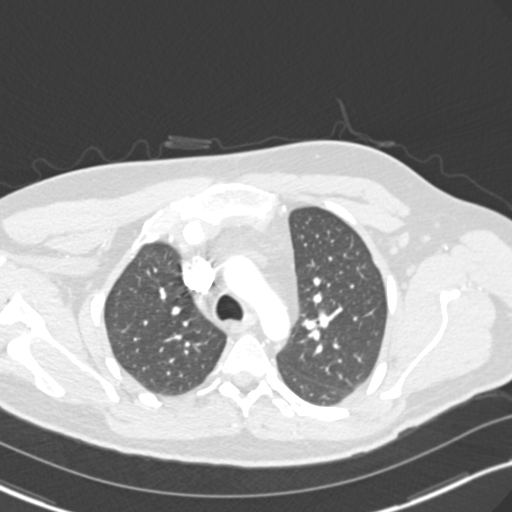
[im 196/266  soft-tissue]
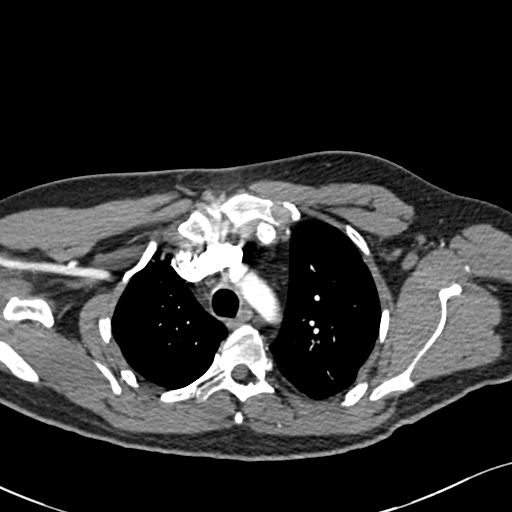
[im 219/266  lung]
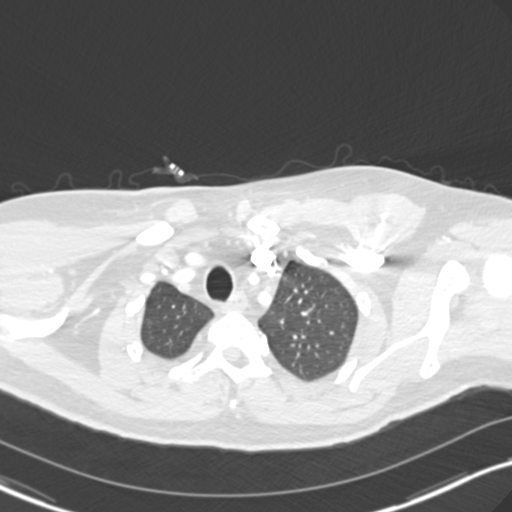
[im 231/266  soft-tissue]
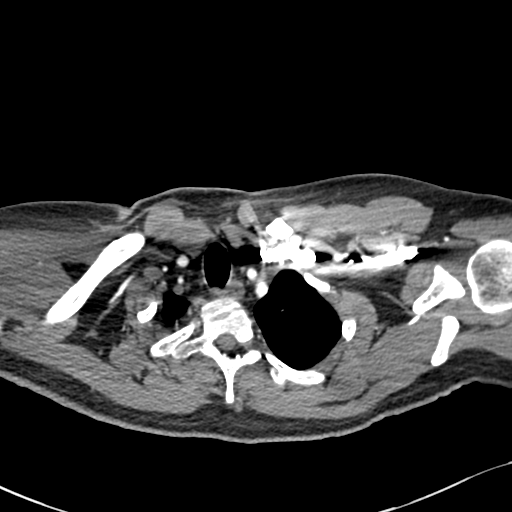
[im 254/266  lung]
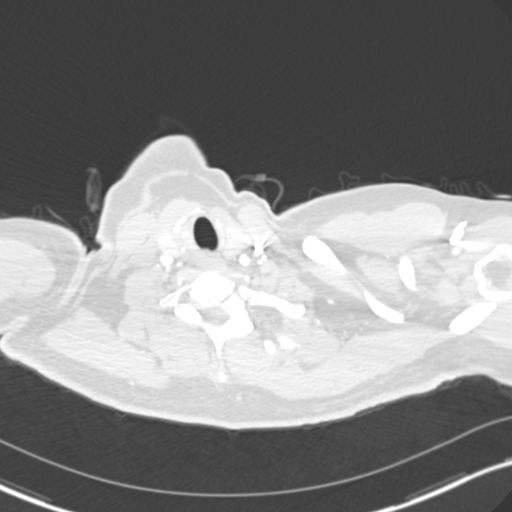

[Series 7: coronal mpr · coronal · 0.55mm/px · 3 of 98 slices shown]
[im 25/98  soft-tissue]
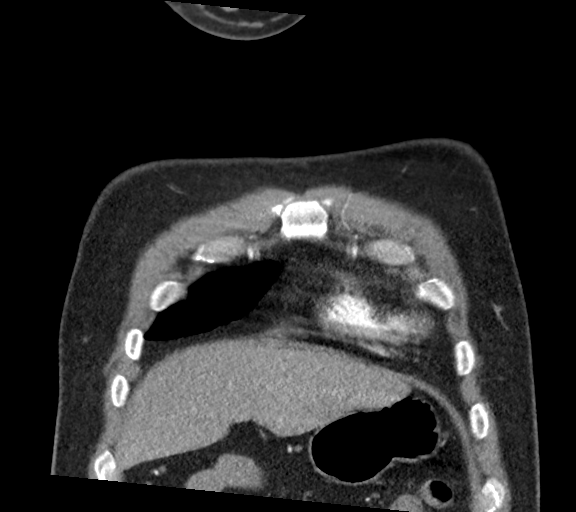
[im 49/98  soft-tissue]
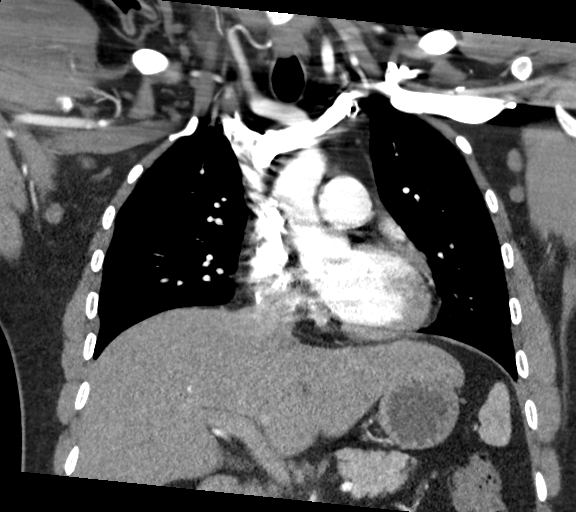
[im 73/98  soft-tissue]
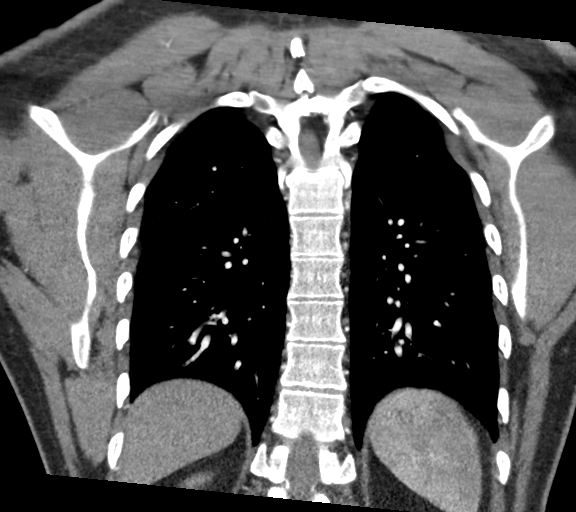

[18 of 46 positions shown; findings below may reference images not displayed]

FINDINGS: Cardiovascular: Contrast injection is sufficient to demonstrate
satisfactory opacification of the pulmonary arteries to the
segmental level. There is no pulmonary embolus or evidence of right
heart strain. The size of the main pulmonary artery is normal. Heart
size is normal, with no pericardial effusion. The course and caliber
of the aorta are normal. There is no atherosclerotic calcification.
Opacification decreased due to pulmonary arterial phase contrast
bolus timing.

Mediastinum/Nodes:

-- No mediastinal lymphadenopathy.

-- No hilar lymphadenopathy.

-- No axillary lymphadenopathy.

-- No supraclavicular lymphadenopathy.

-- Normal thyroid gland where visualized.

-  Unremarkable esophagus.

Lungs/Pleura: Airways are patent. No pleural effusion, lobar
consolidation, pneumothorax or pulmonary infarction.

Upper Abdomen: Contrast bolus timing is not optimized for evaluation
of the abdominal organs. The visualized portions of the organs of
the upper abdomen are normal.

Musculoskeletal: No chest wall abnormality. No bony spinal canal
stenosis.

Review of the MIP images confirms the above findings.
IMPRESSION: No evidence of pulmonary embolism or other acute intrathoracic
process.

## 2021-11-25 IMAGING — US US ABDOMEN LIMITED
1 series · 14 of 25 positions shown · non-contrast
Comparison: 07/26/2019

CLINICAL DATA: Acute epigastric and chest pain for 5 days

EXAM:
ULTRASOUND ABDOMEN LIMITED RIGHT UPPER QUADRANT

[Series 1: us abdomen limited ruq (liver/gb) · 14 of 40 slices shown]
[im 1/40]
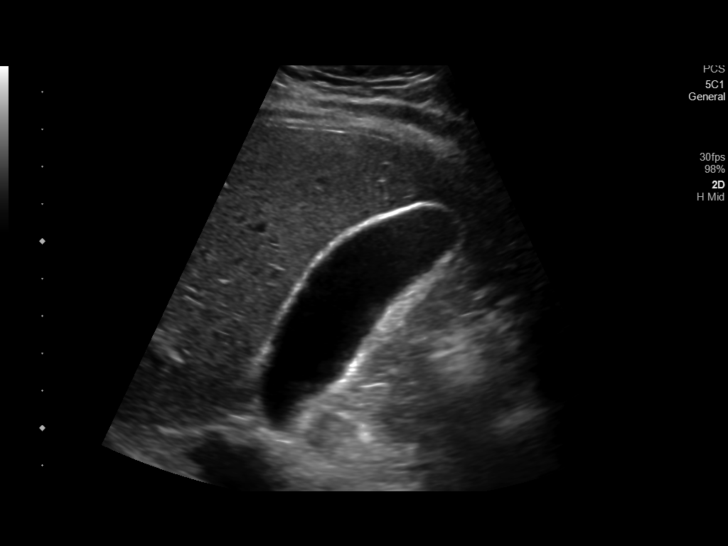
[im 4/40]
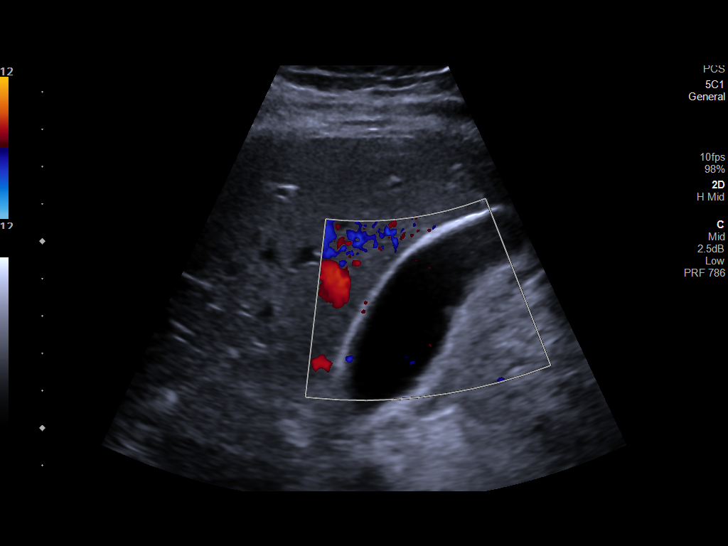
[im 7/40]
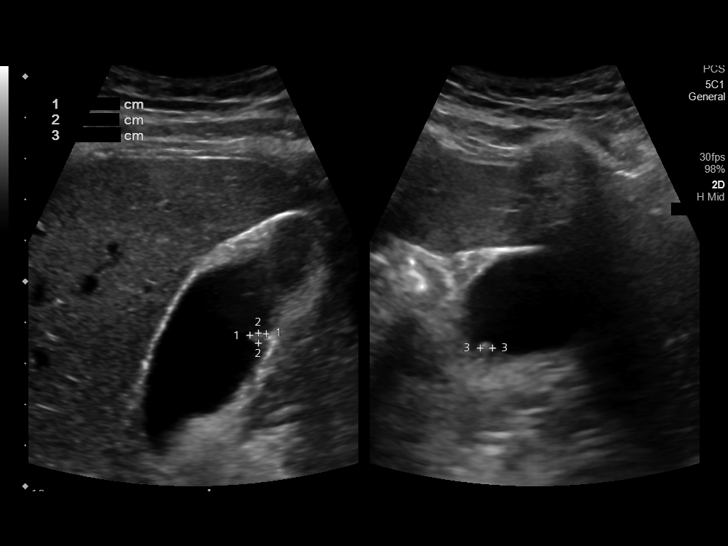
[im 10/40]
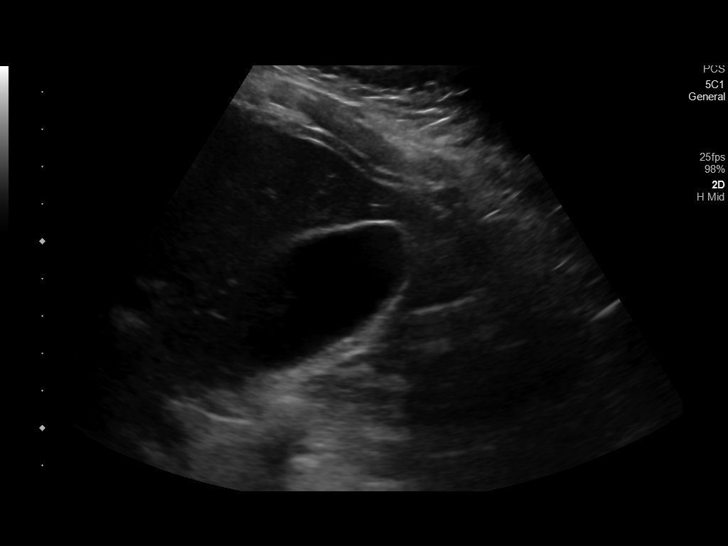
[im 14/40]
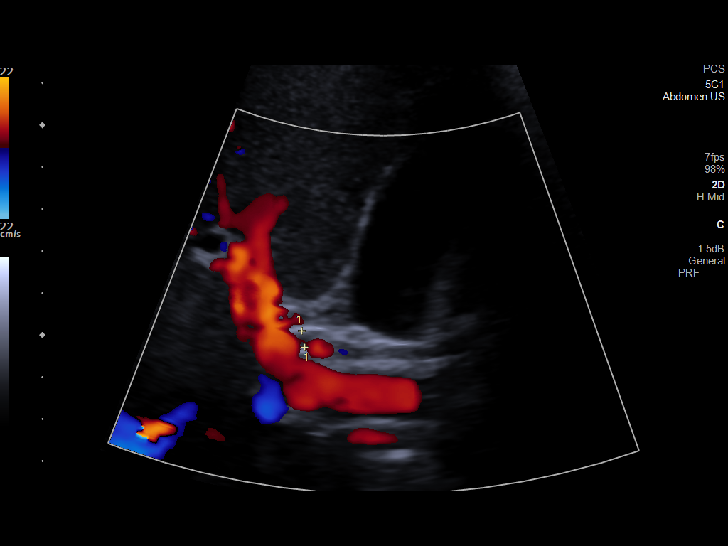
[im 15/40]
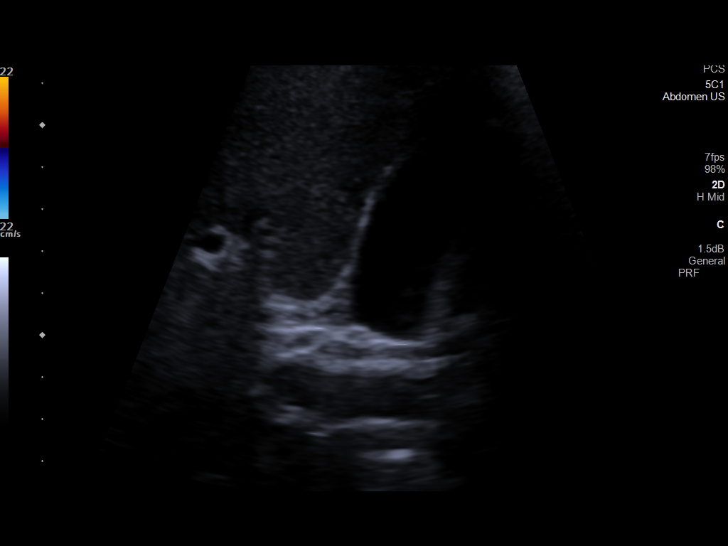
[im 18/40]
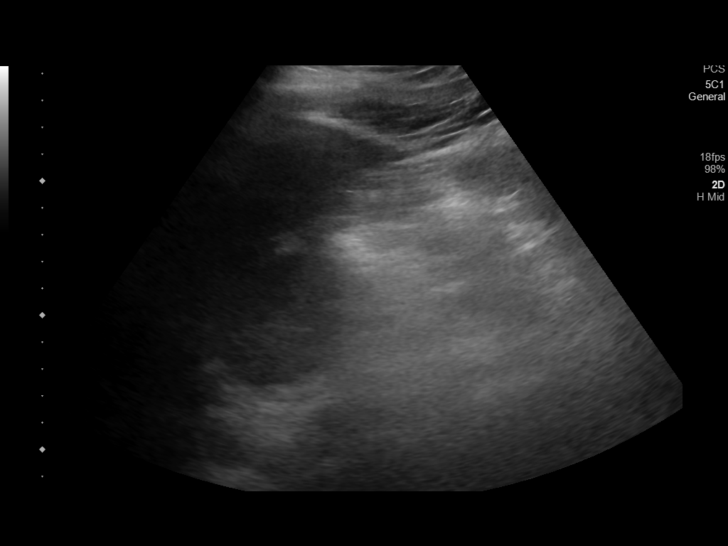
[im 22/40]
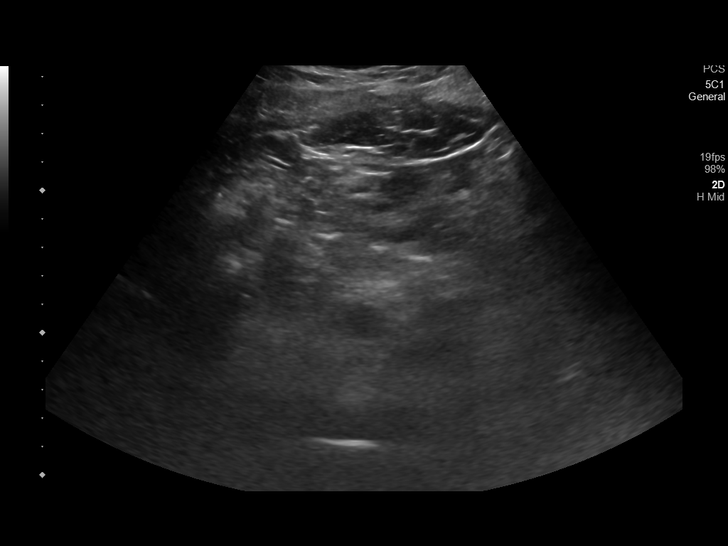
[im 25/40]
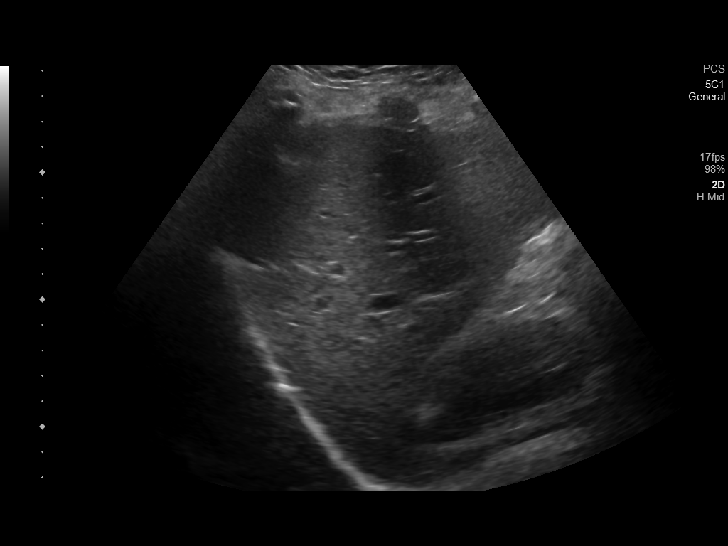
[im 27/40]
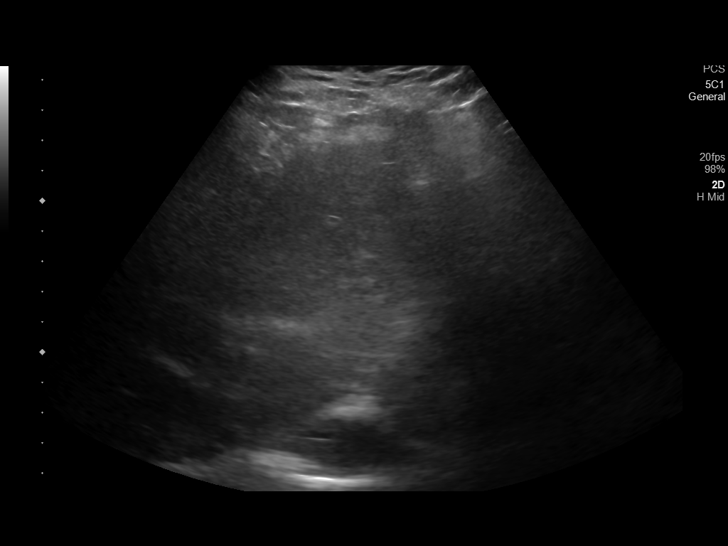
[im 30/40]
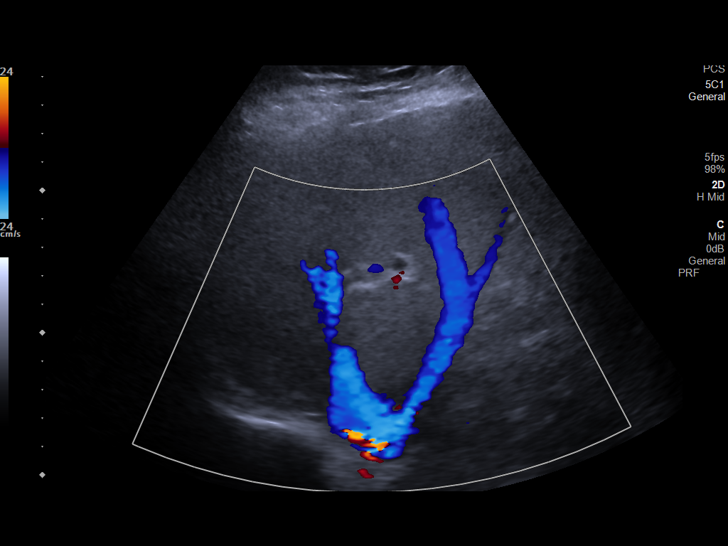
[im 33/40]
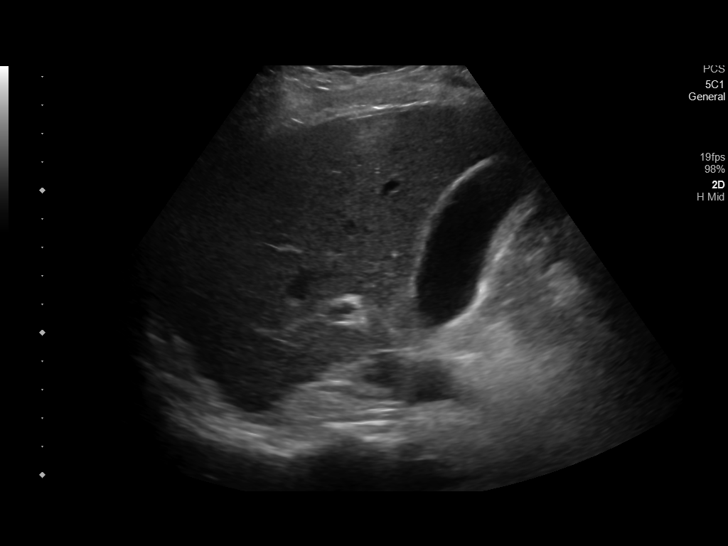
[im 36/40]
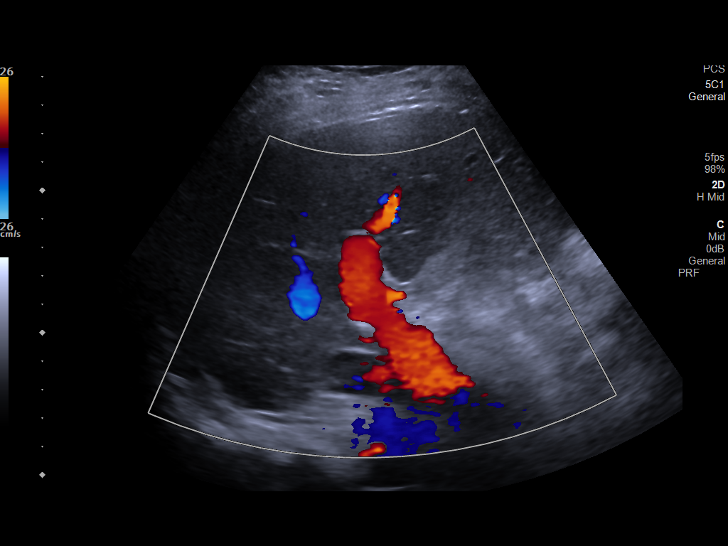
[im 40/40]
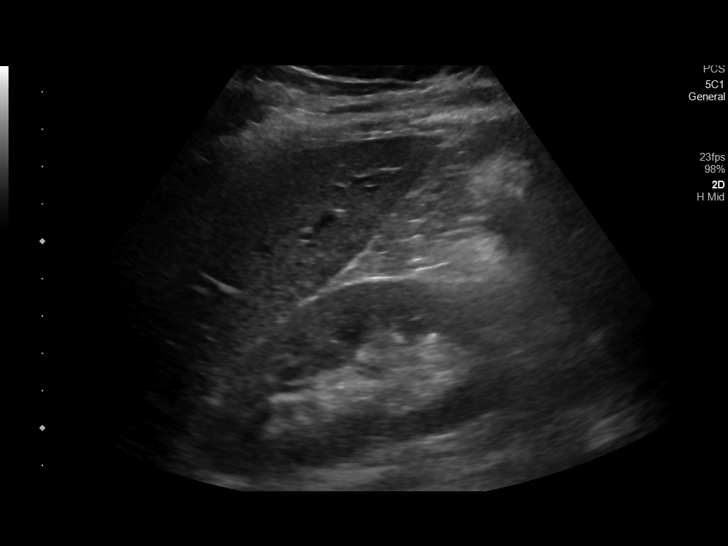

[14 of 25 positions shown; findings below may reference images not displayed]

FINDINGS: Gallbladder:

Normally distended without stones or wall thickening. 4 mm diameter
gallbladder polyp visualized; no follow-up imaging recommended. This
recommendation follows ACR consensus guidelines: White Paper of the
ACR Incidental Findings Committee II on Gallbladder and Biliary
Findings. [HOSPITAL] 3549:;[DATE]. No peristalsis cystic
fluid or sonographic Murphy sign.

Common bile duct:

Diameter: 4 mm, normal

Liver:

Normal echogenicity without mass or nodularity. Portal vein is
patent on color Doppler imaging with normal direction of blood flow
towards the liver.

Other: No RIGHT upper quadrant free fluid.
IMPRESSION: 4 mm gallbladder polyp; no follow-up imaging recommended as above.

Otherwise negative exam.

## 2023-07-22 ENCOUNTER — Ambulatory Visit (INDEPENDENT_AMBULATORY_CARE_PROVIDER_SITE_OTHER): Payer: MEDICAID | Admitting: Family Medicine

## 2023-07-22 ENCOUNTER — Encounter: Payer: Self-pay | Admitting: Family Medicine

## 2023-07-22 VITALS — BP 134/87 | HR 73 | Ht 67.0 in | Wt 181.0 lb

## 2023-07-22 DIAGNOSIS — Z833 Family history of diabetes mellitus: Secondary | ICD-10-CM

## 2023-07-22 DIAGNOSIS — Z1159 Encounter for screening for other viral diseases: Secondary | ICD-10-CM

## 2023-07-22 DIAGNOSIS — Z0000 Encounter for general adult medical examination without abnormal findings: Secondary | ICD-10-CM

## 2023-07-22 DIAGNOSIS — Z0001 Encounter for general adult medical examination with abnormal findings: Secondary | ICD-10-CM | POA: Diagnosis not present

## 2023-07-22 DIAGNOSIS — F840 Autistic disorder: Secondary | ICD-10-CM | POA: Diagnosis not present

## 2023-07-22 DIAGNOSIS — K5900 Constipation, unspecified: Secondary | ICD-10-CM

## 2023-07-22 DIAGNOSIS — R030 Elevated blood-pressure reading, without diagnosis of hypertension: Secondary | ICD-10-CM

## 2023-07-22 DIAGNOSIS — R519 Headache, unspecified: Secondary | ICD-10-CM

## 2023-07-22 DIAGNOSIS — R631 Polydipsia: Secondary | ICD-10-CM | POA: Diagnosis not present

## 2023-07-22 DIAGNOSIS — G8929 Other chronic pain: Secondary | ICD-10-CM

## 2023-07-22 DIAGNOSIS — Z130 Encounter for screening for diseases of the blood and blood-forming organs and certain disorders involving the immune mechanism: Secondary | ICD-10-CM

## 2023-07-22 NOTE — Patient Instructions (Signed)
 Miralax or metamucil daily Could also do Colace twice daily

## 2023-07-22 NOTE — Assessment & Plan Note (Deleted)
 Frequent headaches, occurring 1-2 times weekly. Possible association with caffeine intake or hypertension. Discussed monitoring headache frequency and potential triggers, including caffeine and blood pressure. - Monitor headache frequency and triggers - Evaluate blood pressure management

## 2023-07-22 NOTE — Assessment & Plan Note (Signed)
 Frequent headaches, occurring 1-2 times weekly. Possible association with caffeine intake or hypertension. Discussed monitoring headache frequency and potential triggers, including caffeine and blood pressure. - Monitor headache frequency and triggers - Evaluate blood pressure management

## 2023-07-22 NOTE — Assessment & Plan Note (Signed)
 Chronic constipation with laxative dependency. Has used Dulcolax, Colace, and magnesium citrate over the years; most recently using magnesium citrate frequently (>=2 times per month). Recent bowel movement without laxatives noted. Discussed risks of continued laxative use, including potential bowel dysfunction. Recommended daily MiraLAX or Metamucil, ensuring adequate hydration. Consider Colace twice daily. Potential GI referral if no improvement. - Recommend daily MiraLAX or Metamucil - Ensure adequate hydration - Consider Colace twice daily - Follow-up in 4-6 weeks - Refer to GI if no improvement

## 2023-07-22 NOTE — Assessment & Plan Note (Signed)
 Intermittent elevated blood pressure readings at home. No current symptoms such as vision changes or palpitations. Blood pressure normal today. Discussed home monitoring and lifestyle modifications. - Monitor blood pressure at home - Follow-up if high readings persist

## 2023-07-22 NOTE — Progress Notes (Signed)
 New patient visit   Patient: Mark Webb   DOB: 1991-05-31   32 y.o. Male  MRN: [id number] Visit Date: 07/22/2023  Today's healthcare provider: LAURAINE LOISE BUOY, DO   Chief Complaint  Patient presents with   Establish Care    Patient with autism presents with mother to establish care.  Patient was former patient of Marinda Negri, PA who previously worked at [employer].  Patient's mother would like him to be  checked for diabetes and is also concerned about the use of stool softeners and laxatives more frequently than he probably should   Subjective    Mark Webb is a 33 y.o. male who presents today as a new patient to establish care.  HPI HPI     Establish Care    Additional comments: Patient with autism presents with mother to establish care.  Patient was former patient of Marinda Negri, PA who previously worked at [employer].  Patient's mother would like him to be  checked for diabetes and is also concerned about the use of stool softeners and laxatives more frequently than he probably should      Last edited by Kathi Buel BIRCH, CMA on 07/22/2023  2:07 PM.    Mark Webb is a 33 year old male who presents with concerns of constipation and potential diabetes. He is accompanied by his mother, Ellouise.  He frequently experiences constipation and uses magnesium citrate regularly, at least twice a month. He has also has intermittently use Dulcolax and MiraLAX. Despite this, he had a bowel movement last night without taking any laxatives. His mother is concerned about potential laxative dependency. He has not had a physical examination since childhood.  There is concern for potential diabetes due to his dietary habits, which include consuming a lot of candy, sweets, and soda. He also has frequent sores on his legs. No increase in urination frequency is reported. There is a family history of diabetes, including his maternal and paternal aunts.  He experiences  headaches at least once or twice a week. There is a history of high blood pressure readings taken at home, but specific values are not recalled. No changes in vision. Family history of hypertension is noted.  He reports occasional lower left abdominal pain, which his mother initially thought might be related to appendicitis, but it was noted that the appendix is on the right side. No nausea, vomiting, or loose stools.  His current medications include vitamin D  supplementation. He has previously been prescribed Protonix  for reflux and Reglan  for GI motility issues, but he is not currently taking these medications. He has also used over-the-counter supplements like CoQ10 and red yeast rice in the past.   Past Medical History:  Diagnosis Date   Autism    History reviewed. No pertinent surgical history. Family Status  Relation Name Status   Mother  Alive   Father  Alive   Sister  Alive   Brother  Alive   MGM  Alive   MGF  Deceased   PGM  Alive   PGF  Deceased  No partnership data on file   Family History  Problem Relation Age of Onset   Diverticulitis Mother    Stroke Father    Hypertension Father    Healthy Sister    Healthy Brother    Hypertension Maternal Grandmother    Thyroid disease Maternal Grandmother    Lymphoma Maternal Grandmother    Arthritis Maternal Grandmother  COPD Maternal Grandfather    Hypertension Paternal Grandmother    Diabetes Paternal Grandfather    COPD Paternal Grandfather    Social History   Socioeconomic History   Marital status: Single    Spouse name: Not on file   Number of children: Not on file   Years of education: Not on file   Highest education level: Not on file  Occupational History   Not on file  Tobacco Use   Smoking status: Never   Smokeless tobacco: Never  Substance and Sexual Activity   Alcohol use: Yes    Alcohol/week: 0.0 standard drinks of alcohol   Drug use: No   Sexual activity: Never  Other Topics Concern   Not on  file  Social History Narrative   Not on file   Social Drivers of Health   Financial Resource Strain: Not on file  Food Insecurity: Not on file  Transportation Needs: Not on file  Physical Activity: Not on file  Stress: Not on file  Social Connections: Not on file   Outpatient Medications Prior to Visit  Medication Sig   Vitamin D , Cholecalciferol, 10 MCG (400 UNIT) TABS Take by mouth.   vitamin E 180 MG (400 UNITS) capsule Take 400 Units by mouth daily.   co-enzyme Q-10 30 MG capsule Take 30 mg by mouth 3 (three) times daily.   metoCLOPramide  (REGLAN ) 10 MG tablet Take 1 tablet (10 mg total) by mouth 3 (three) times daily with meals.   Omega-3 Fatty Acids (FISH OIL) 1000 MG CAPS Take by mouth.   omeprazole  (PRILOSEC) 20 MG capsule Take 1 capsule (20 mg total) by mouth daily.   [DISCONTINUED] amoxicillin  (AMOXIL ) 875 MG tablet Take 1 tablet (875 mg total) by mouth 2 (two) times daily.   [DISCONTINUED] pantoprazole  (PROTONIX ) 40 MG tablet Take 1 tablet (40 mg total) by mouth daily.   [DISCONTINUED] Red Yeast Rice Extract (RED YEAST RICE PO) Take by mouth.   No facility-administered medications prior to visit.   No Known Allergies  Immunization History  Administered Date(s) Administered   Influenza,inj,Quad PF,6+ Mos 03/28/2013   Tdap 05/07/2015    Health Maintenance  Topic Date Due   INFLUENZA VACCINE  09/12/2023 (Originally 01/13/2023)   COVID-19 Vaccine (1 - 2024-25 season) 02/13/2024 (Originally 02/13/2023)   DTaP/Tdap/Td (2 - Td or Tdap) 05/06/2025   Hepatitis C Screening  Completed   HIV Screening  Completed   HPV VACCINES  Aged Out    Patient Care Team: Johnedward Brodrick, Lauraine SAILOR, DO as PCP - General (Family Medicine)  Review of Systems  Constitutional:  Negative for appetite change, chills, fatigue and fever.  HENT:  Negative for congestion, ear pain, hearing loss, nosebleeds and trouble swallowing.   Eyes:  Negative for pain and visual disturbance.  Respiratory:  Negative  for cough, chest tightness and shortness of breath.   Cardiovascular:  Negative for chest pain, palpitations and leg swelling.  Gastrointestinal:  Positive for constipation. Negative for abdominal pain, blood in stool, diarrhea, nausea and vomiting.  Endocrine: Positive for polydipsia (soda) and polyphagia (sweets). Negative for polyuria.  Genitourinary:  Negative for dysuria and flank pain.  Musculoskeletal:  Negative for arthralgias, back pain, joint swelling, myalgias and neck stiffness.  Skin:  Negative for color change, rash and wound.  Neurological:  Positive for headaches. Negative for dizziness, tremors, seizures, speech difficulty, weakness and light-headedness.  Psychiatric/Behavioral:  Negative for behavioral problems, confusion, decreased concentration, dysphoric mood and sleep disturbance. The patient is not nervous/anxious.  All other systems reviewed and are negative.       Objective    BP 134/87 (BP Location: Right Arm, Patient Position: Sitting, Cuff Size: Normal)   Pulse 73   Ht 5' 7 (1.702 m)   Wt 181 lb (82.1 kg)   SpO2 99%   BMI 28.35 kg/m     Physical Exam Vitals and nursing note reviewed.  Constitutional:      General: He is awake.     Appearance: Normal appearance.  HENT:     Head: Normocephalic and atraumatic.     Right Ear: Tympanic membrane, ear canal and external ear normal.     Left Ear: Tympanic membrane, ear canal and external ear normal.     Nose: Nose normal.     Mouth/Throat:     Mouth: Mucous membranes are moist.     Pharynx: Oropharynx is clear. No oropharyngeal exudate or posterior oropharyngeal erythema.  Eyes:     General: No scleral icterus.    Extraocular Movements: Extraocular movements intact.     Conjunctiva/sclera: Conjunctivae normal.     Pupils: Pupils are equal, round, and reactive to light.  Neck:     Thyroid: No thyromegaly or thyroid tenderness.  Cardiovascular:     Rate and Rhythm: Normal rate and regular rhythm.      Pulses: Normal pulses.     Heart sounds: Normal heart sounds.  Pulmonary:     Effort: Pulmonary effort is normal. No tachypnea, bradypnea or respiratory distress.     Breath sounds: Normal breath sounds. No stridor. No wheezing, rhonchi or rales.  Abdominal:     General: Bowel sounds are normal. There is no distension.     Palpations: Abdomen is soft. There is no mass.     Tenderness: There is no abdominal tenderness. There is no guarding.     Hernia: No hernia is present.  Musculoskeletal:     Cervical back: Normal range of motion and neck supple.     Right lower leg: No edema.     Left lower leg: No edema.  Lymphadenopathy:     Cervical: No cervical adenopathy.  Skin:    General: Skin is warm and dry.  Neurological:     Mental Status: He is alert and oriented to person, place, and time. Mental status is at baseline.  Psychiatric:        Mood and Affect: Mood normal.        Behavior: Behavior normal.     Depression Screen    07/22/2023    2:00 PM 08/24/2019    3:10 PM 10/06/2017   10:04 AM  PHQ 2/9 Scores  PHQ - 2 Score 0 0 0  PHQ- 9 Score 3 0 0   Results for orders placed or performed in visit on 07/22/23  Comprehensive metabolic panel  Result Value Ref Range   Glucose 83 70 - 99 mg/dL   BUN 12 6 - 20 mg/dL   Creatinine, Ser 9.09 0.76 - 1.27 mg/dL   eGFR 883 >40 fO/fpw/8.26   BUN/Creatinine Ratio 13 9 - 20   Sodium 139 134 - 144 mmol/L   Potassium 4.2 3.5 - 5.2 mmol/L   Chloride 103 96 - 106 mmol/L   CO2 22 20 - 29 mmol/L   Calcium 9.7 8.7 - 10.2 mg/dL   Total Protein 7.2 6.0 - 8.5 g/dL   Albumin 4.7 4.1 - 5.1 g/dL   Globulin, Total 2.5 1.5 - 4.5 g/dL   Bilirubin Total 0.3  0.0 - 1.2 mg/dL   Alkaline Phosphatase 66 44 - 121 IU/L   AST 17 0 - 40 IU/L   ALT 19 0 - 44 IU/L  Hemoglobin A1c  Result Value Ref Range   Hgb A1c MFr Bld 5.9 (H) 4.8 - 5.6 %   Est. average glucose Bld gHb Est-mCnc 123 mg/dL  Lipid panel  Result Value Ref Range   Cholesterol, Total 297  (H) 100 - 199 mg/dL   Triglycerides 858 0 - 149 mg/dL   HDL 42 >60 mg/dL   VLDL Cholesterol Cal 26 5 - 40 mg/dL   LDL Chol Calc (NIH) 770 (H) 0 - 99 mg/dL   LDL CALC COMMENT: Comment    Chol/HDL Ratio 7.1 (H) 0.0 - 5.0 ratio  TSH Rfx on Abnormal to Free T4  Result Value Ref Range   TSH 1.190 0.450 - 4.500 uIU/mL  HCV Ab w Reflex to Quant PCR  Result Value Ref Range   HCV Ab Non Reactive Non Reactive  Interpretation:  Result Value Ref Range   HCV Interp 1: Comment     Assessment & Plan     Encounter for medical examination to establish care Assessment & Plan: No physical exam for the past couple years. No recent flu shot or COVID-19 booster. No hepatitis C screening on record. Discussed importance of regular exams and vaccinations. - Screen for hepatitis C - Encourage regular physical exams - Encourage flu shot and COVID-19 booster if not up to date  Orders: -     Comprehensive metabolic panel -     Lipid panel  Active autistic disorder Assessment & Plan: Noted. Lives with his mother who manages his needs.   Constipation, unspecified constipation type Assessment & Plan: Chronic constipation with laxative dependency. Has used Dulcolax, Colace, and magnesium citrate over the years; most recently using magnesium citrate frequently (>=2 times per month). Recent bowel movement without laxatives noted. Discussed risks of continued laxative use, including potential bowel dysfunction. Recommended daily MiraLAX or Metamucil, ensuring adequate hydration. Consider Colace twice daily. Potential GI referral if no improvement. - Recommend daily MiraLAX or Metamucil - Ensure adequate hydration - Consider Colace twice daily - Follow-up in 4-6 weeks - Refer to GI if no improvement  Orders: -     TSH Rfx on Abnormal to Free T4  Polydipsia Assessment & Plan: Polydipsia and polyphagia. Concern for diabetes due to high intake of sweets and soda, leg sores, and family history. No polyuria  reported. Discussed importance of screening given risk factors. - Order A1c and metabolic panel  Orders: -     Hemoglobin A1c  Encounter for hepatitis C screening test for low risk patient -     HCV Ab w Reflex to Quant PCR -     Interpretation:  Screening for endocrine, metabolic and immunity disorder -     Comprehensive metabolic panel -     Hemoglobin A1c  Family history of diabetes mellitus -     Hemoglobin A1c  Chronic nonintractable headache, unspecified headache type Assessment & Plan: Frequent headaches, occurring 1-2 times weekly. Possible association with caffeine intake or hypertension. Discussed monitoring headache frequency and potential triggers, including caffeine and blood pressure. - Monitor headache frequency and triggers - Evaluate blood pressure management   Elevated blood-pressure reading without diagnosis of hypertension Assessment & Plan: Intermittent elevated blood pressure readings at home. No current symptoms such as vision changes or palpitations. Blood pressure normal today. Discussed home monitoring and lifestyle modifications. - Monitor  blood pressure at home - Follow-up if high readings persist    Follow-up - Follow-up in 4-6 weeks for constipation management - Monitor blood pressure at home and follow-up if high readings persist.   Return in about 4 weeks (around 08/19/2023) for Constipation.     I discussed the assessment and treatment plan with the patient  The patient was provided an opportunity to ask questions and all were answered. The patient agreed with the plan and demonstrated an understanding of the instructions.   The patient was advised to call back or seek an in-person evaluation if the symptoms worsen or if the condition fails to improve as anticipated.    LAURAINE LOISE BUOY, DO  [facility] [phone] (phone) [phone] (fax)  [facility]

## 2023-07-22 NOTE — Assessment & Plan Note (Signed)
 No physical exam for the past couple years. No recent flu shot or COVID-19 booster. No hepatitis C screening on record. Discussed importance of regular exams and vaccinations. - Screen for hepatitis C - Encourage regular physical exams - Encourage flu shot and COVID-19 booster if not up to date

## 2023-07-22 NOTE — Assessment & Plan Note (Signed)
 Polydipsia and polyphagia. Concern for diabetes due to high intake of sweets and soda, leg sores, and family history. No polyuria reported. Discussed importance of screening given risk factors. - Order A1c and metabolic panel

## 2023-07-23 LAB — COMPREHENSIVE METABOLIC PANEL
ALT: 19 [IU]/L (ref 0–44)
AST: 17 [IU]/L (ref 0–40)
Albumin: 4.7 g/dL (ref 4.1–5.1)
Alkaline Phosphatase: 66 [IU]/L (ref 44–121)
BUN/Creatinine Ratio: 13 (ref 9–20)
BUN: 12 mg/dL (ref 6–20)
Bilirubin Total: 0.3 mg/dL (ref 0.0–1.2)
CO2: 22 mmol/L (ref 20–29)
Calcium: 9.7 mg/dL (ref 8.7–10.2)
Chloride: 103 mmol/L (ref 96–106)
Creatinine, Ser: 0.9 mg/dL (ref 0.76–1.27)
Globulin, Total: 2.5 g/dL (ref 1.5–4.5)
Glucose: 83 mg/dL (ref 70–99)
Potassium: 4.2 mmol/L (ref 3.5–5.2)
Sodium: 139 mmol/L (ref 134–144)
Total Protein: 7.2 g/dL (ref 6.0–8.5)
eGFR: 116 mL/min/{1.73_m2} (ref >59)

## 2023-07-23 LAB — HCV AB W REFLEX TO QUANT PCR: HCV Ab: NONREACTIVE

## 2023-07-23 LAB — LIPID PANEL
Chol/HDL Ratio: 7.1 {ratio} — ABNORMAL HIGH (ref 0.0–5.0)
Cholesterol, Total: 297 mg/dL — ABNORMAL HIGH (ref 100–199)
HDL: 42 mg/dL (ref >39)
LDL Chol Calc (NIH): 229 mg/dL — ABNORMAL HIGH (ref 0–99)
Triglycerides: 141 mg/dL (ref 0–149)
VLDL Cholesterol Cal: 26 mg/dL (ref 5–40)

## 2023-07-23 LAB — HCV INTERPRETATION

## 2023-07-23 LAB — TSH RFX ON ABNORMAL TO FREE T4: TSH: 1.19 u[IU]/mL (ref 0.450–4.500)

## 2023-07-23 LAB — HEMOGLOBIN A1C
Est. average glucose Bld gHb Est-mCnc: 123 mg/dL
Hgb A1c MFr Bld: 5.9 % — ABNORMAL HIGH (ref 4.8–5.6)

## 2023-07-28 ENCOUNTER — Other Ambulatory Visit: Payer: Self-pay | Admitting: Family Medicine

## 2023-07-28 DIAGNOSIS — E7800 Pure hypercholesterolemia, unspecified: Secondary | ICD-10-CM

## 2023-07-29 ENCOUNTER — Ambulatory Visit: Payer: Self-pay | Admitting: *Deleted

## 2023-07-29 ENCOUNTER — Telehealth: Payer: Self-pay | Admitting: *Deleted

## 2023-07-29 DIAGNOSIS — E7800 Pure hypercholesterolemia, unspecified: Secondary | ICD-10-CM

## 2023-07-29 NOTE — Telephone Encounter (Signed)
This is not our patient at [facility].

## 2023-07-29 NOTE — Telephone Encounter (Signed)
Reason for Disposition  [1] Caller requesting NON-URGENT health information AND [2] PCP's office is the best resource    Wants to discuss other meds other than statins.  Answer Assessment - Initial Assessment Questions 1. REASON FOR CALL or QUESTION: "What is your reason for calling today?" or "How can I best help you?" or "What question do you have that I can help answer?"     I talked with a nurse earlier today.   It was about test results.   She said something about A1C regarding diabetes.  What was said about that? Is his cholesterol real high or what?  Protocols used: Information Only Call - No Triage-A-AH

## 2023-07-29 NOTE — Telephone Encounter (Signed)
  Chief Complaint: Mother, Mark Webb, On DPR wants to discuss with Dr. Payton Mccallum alternatives to him taking statins drugs.   "I'm not too crazy about those medications".   She was requesting the actual numbers of his lab results which I gave her.   (Rosuvastatin 40 mg has been ordered for him). Symptoms: N/A Frequency: N/A Pertinent Negatives: Patient denies N/A Disposition: [] ED /[] Urgent Care (no appt availability in office) / [] Appointment(In office/virtual)/ []  [facility] [facility]/ [] Home Care/ [] Refused Recommended Disposition /[] [facility]/ [x]  Follow-up with PCP Additional Notes: Message sent to Dr. Payton Mccallum that Inetta Fermo wants to discuss alternatives for statins for [name].

## 2023-07-29 NOTE — Telephone Encounter (Signed)
Patient's mother, Mark Webb) called and notified: Lab results normal except for elevated "bad" cholesterol (LDL) and mildly elevated A1c.  - Reduce carbohydrates in diet and increase cardiovascular exercise to bring both of these down. - I recommend drawing additional blood work to check for genetic high cholesterol, which I have ordered. Please be sure to fast for at least eight hours. We will need to start cholesterol medications but I would like these labs drawn first.    May send: "Rosuvastatin 40 mg at bedtime, #90 tablets, 3 refills"  with understanding to start after blood work is drawn.  Mark Fermo states she wants to hold off on medication(concerned about SE)- but will have labs drawn for further information.

## 2023-07-31 NOTE — Assessment & Plan Note (Signed)
Noted. Lives with his mother who manages his needs.

## 2023-08-02 NOTE — Telephone Encounter (Signed)
I spoke to mother and she would like a referral to Cardiology for management of Repatha.

## 2023-08-02 NOTE — Addendum Note (Signed)
Addended by: Jacquenette Shone on: 08/02/2023 04:25 PM   Modules accepted: Orders

## 2024-06-15 ENCOUNTER — Encounter: Payer: Self-pay | Admitting: Family Medicine

## 2024-06-15 ENCOUNTER — Ambulatory Visit: Payer: MEDICAID | Admitting: Family Medicine

## 2024-06-15 VITALS — BP 131/85 | HR 91 | Resp 16 | Ht 67.0 in | Wt 178.5 lb

## 2024-06-15 DIAGNOSIS — M542 Cervicalgia: Secondary | ICD-10-CM

## 2024-06-15 MED ORDER — PREDNISONE 20 MG PO TABS: ORAL_TABLET | ORAL | 0 refills | Status: AC

## 2024-06-15 NOTE — Progress Notes (Signed)
 "     Established patient visit   Patient: Mark Webb   DOB: June 08, 1991   34 y.o. Male  MRN: [id number] Visit Date: 06/15/2024  Today's healthcare provider: LAURAINE LOISE BUOY, DO   Chief Complaint  Patient presents with   Neck Pain    It has been going on for 6 months or more.    Subjective    Neck Pain  Pertinent negatives include no fever or headaches.    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  He has been experiencing neck pain for over six months, which restricts his ability to turn his neck to both sides. The pain is localized to the neck without radiation to other areas. There is no history of recent neck injury. He uses over-the-counter medications such as Alphenol, Advil, and Tylenol, as well as topical treatments like Odis Siad, [address], and Max Freeze, which he finds helpful, although he does not take these medications on a scheduled basis.  No associated symptoms such as fever, chills, shortness of breath, chest pain, or vision changes. He reports occasional headaches that occur intermittently but denies recent occurrences. No dizziness or lightheadedness. He sleeps on his side and switches sides during the night. The neck pain does not typically interfere with his sleep, although he sometimes experiences discomfort at night, which he alleviates with topical treatments. He endorses being able to turn his neck after applying the topical treatments.      Medications: Show/hide medication list[1]  Review of Systems  Constitutional:  Negative for chills and fever.  Eyes:  Negative for visual disturbance.  Musculoskeletal:  Positive for neck pain (bilateral, posterolateral).  Neurological:  Negative for headaches.        Objective    BP 131/85 (BP Location: Right Arm, Patient Position: Sitting, Cuff Size: Normal)   Pulse 91   Resp 16   Ht 5' 7 (1.702 m)   Wt 178 lb 8 oz (81  kg)   SpO2 98%   BMI 27.96 kg/m     Physical Exam Vitals and nursing note reviewed.  Constitutional:      General: He is not in acute distress.    Appearance: Normal appearance.  HENT:     Head: Normocephalic and atraumatic.  Eyes:     General: No scleral icterus.    Conjunctiva/sclera: Conjunctivae normal.  Cardiovascular:     Rate and Rhythm: Normal rate.  Pulmonary:     Effort: Pulmonary effort is normal.  Musculoskeletal:     Cervical back: Rigidity (d/t pain with turning neck) present. No tenderness.  Lymphadenopathy:     Cervical: No cervical adenopathy.  Neurological:     Mental Status: He is alert and oriented to person, place, and time. Mental status is at baseline.  Psychiatric:        Mood and Affect: Mood normal.        Behavior: Behavior normal.      No results found for any visits on 06/15/24.  Assessment & Plan    Bilateral neck pain -     predniSONE; Take 60mg  PO daily x 2 days, then40mg  PO daily x 2 days, then 20mg  PO daily x 3 days  Dispense: 13 tablet; Refill: 0     Bilateral neck pain Chronic neck pain likely muscular, exacerbated by poor movement and sleeping position. Muscular etiology favored over disc-related issues due  to lack of systemic symptoms and response to topical treatments. - Prescribed steroid taper pack for inflammation and mobility. - Advised to avoid NSAIDs during treatment with prednisone. - Advised neck exercises for range of motion and stiffness prevention. - Discussed application of ice or heat for relief, based on comfort. - Consider referral to physical therapy if needed.  General Health Maintenance Reviewed vaccination status and general health maintenance. All childhood vaccines received, three COVID-19 vaccinations completed (declines further), HPV vaccine declined.    Return if symptoms worsen or fail to improve.      I discussed the assessment and treatment plan with the patient  The patient was provided an  opportunity to ask questions and all were answered. The patient agreed with the plan and demonstrated an understanding of the instructions.   The patient was advised to call back or seek an in-person evaluation if the symptoms worsen or if the condition fails to improve as anticipated.    LAURAINE LOISE BUOY, DO  [facility] [phone] (phone) [phone] (fax)  [facility]    [1]  Outpatient Medications Prior to Visit  Medication Sig   Plant Sterols and Stanols (CHOLEST OFF PO) Take by mouth.   Vitamin D , Cholecalciferol, 10 MCG (400 UNIT) TABS Take by mouth.   [DISCONTINUED] co-enzyme Q-10 30 MG capsule Take 30 mg by mouth 3 (three) times daily.   [DISCONTINUED] metoCLOPramide  (REGLAN ) 10 MG tablet Take 1 tablet (10 mg total) by mouth 3 (three) times daily with meals.   [DISCONTINUED] Omega-3 Fatty Acids (FISH OIL) 1000 MG CAPS Take by mouth.   [DISCONTINUED] omeprazole  (PRILOSEC) 20 MG capsule Take 1 capsule (20 mg total) by mouth daily.   [DISCONTINUED] vitamin E 180 MG (400 UNITS) capsule Take 400 Units by mouth daily.   No facility-administered medications prior to visit.   "
